# Patient Record
Sex: Female | Born: 1960
Health system: Southern US, Community
[De-identification: ages and names within clinical notes are randomized; demographics above are authoritative.]

## PROBLEM LIST (undated history)

## (undated) DIAGNOSIS — F418 Other specified anxiety disorders: Principal | ICD-10-CM

## (undated) DIAGNOSIS — R030 Elevated blood-pressure reading, without diagnosis of hypertension: Secondary | ICD-10-CM

## (undated) DIAGNOSIS — H9190 Unspecified hearing loss, unspecified ear: Secondary | ICD-10-CM

## (undated) DIAGNOSIS — R112 Nausea with vomiting, unspecified: Secondary | ICD-10-CM

## (undated) DIAGNOSIS — F419 Anxiety disorder, unspecified: Secondary | ICD-10-CM

## (undated) DIAGNOSIS — H04123 Dry eye syndrome of bilateral lacrimal glands: Secondary | ICD-10-CM

## (undated) DIAGNOSIS — R7303 Prediabetes: Secondary | ICD-10-CM

## (undated) DIAGNOSIS — E7800 Pure hypercholesterolemia, unspecified: Secondary | ICD-10-CM

## (undated) DIAGNOSIS — F329 Major depressive disorder, single episode, unspecified: Secondary | ICD-10-CM

## (undated) DIAGNOSIS — K219 Gastro-esophageal reflux disease without esophagitis: Secondary | ICD-10-CM

## (undated) DIAGNOSIS — E559 Vitamin D deficiency, unspecified: Secondary | ICD-10-CM

## (undated) DIAGNOSIS — E663 Overweight: Secondary | ICD-10-CM

## (undated) DIAGNOSIS — I10 Essential (primary) hypertension: Secondary | ICD-10-CM

## (undated) DIAGNOSIS — R5383 Other fatigue: Secondary | ICD-10-CM

## (undated) DIAGNOSIS — E039 Hypothyroidism, unspecified: Secondary | ICD-10-CM

## (undated) DIAGNOSIS — R0602 Shortness of breath: Secondary | ICD-10-CM

## (undated) DIAGNOSIS — K760 Fatty (change of) liver, not elsewhere classified: Secondary | ICD-10-CM

## (undated) DIAGNOSIS — Z9889 Other specified postprocedural states: Secondary | ICD-10-CM

## (undated) DIAGNOSIS — G4730 Sleep apnea, unspecified: Secondary | ICD-10-CM

## (undated) DIAGNOSIS — R42 Dizziness and giddiness: Secondary | ICD-10-CM

## (undated) DIAGNOSIS — F32A Depression, unspecified: Secondary | ICD-10-CM

## (undated) DIAGNOSIS — N979 Female infertility, unspecified: Secondary | ICD-10-CM

## (undated) DIAGNOSIS — Z9621 Cochlear implant status: Secondary | ICD-10-CM

## (undated) HISTORY — DX: Dizziness and giddiness: R42

## (undated) HISTORY — DX: Gastro-esophageal reflux disease without esophagitis: K21.9

## (undated) HISTORY — PX: DILATION AND CURETTAGE OF UTERUS: SHX78

## (undated) HISTORY — DX: Prediabetes: R73.03

## (undated) HISTORY — DX: Fatty (change of) liver, not elsewhere classified: K76.0

## (undated) HISTORY — DX: Hypothyroidism, unspecified: E03.9

## (undated) HISTORY — PX: EYE SURGERY: SHX253

## (undated) HISTORY — DX: Shortness of breath: R06.02

## (undated) HISTORY — DX: Major depressive disorder, single episode, unspecified: F32.9

## (undated) HISTORY — DX: Overweight: E66.3

## (undated) HISTORY — DX: Cochlear implant status: Z96.21

## (undated) HISTORY — DX: Vitamin D deficiency, unspecified: E55.9

## (undated) HISTORY — DX: Elevated blood-pressure reading, without diagnosis of hypertension: R03.0

## (undated) HISTORY — DX: Pure hypercholesterolemia, unspecified: E78.00

## (undated) HISTORY — DX: Other specified anxiety disorders: F41.8

## (undated) HISTORY — PX: FOOT SURGERY: SHX648

## (undated) HISTORY — DX: Anxiety disorder, unspecified: F41.9

## (undated) HISTORY — DX: Female infertility, unspecified: N97.9

## (undated) HISTORY — DX: Sleep apnea, unspecified: G47.30

## (undated) HISTORY — DX: Essential (primary) hypertension: I10

## (undated) HISTORY — PX: TONSILLECTOMY: SHX5217

## (undated) HISTORY — DX: Other fatigue: R53.83

## (undated) HISTORY — DX: Depression, unspecified: F32.A

## (undated) HISTORY — DX: Dry eye syndrome of bilateral lacrimal glands: H04.123

## (undated) HISTORY — DX: Unspecified hearing loss, unspecified ear: H91.90

## (undated) SURGICAL SUPPLY — 78 items
ATTRACTOMAT 16X20 MAGNETIC DRP (DRAPES)
BAG DECANTER FOR FLEXI CONT (MISCELLANEOUS) ×2
BENZOIN TINCTURE PRP APPL 2/3 (GAUZE/BANDAGES/DRESSINGS)
BLADE CLIPPER SURG (BLADE) ×2
BLADE NEEDLE 3 SS STRL (BLADE) ×1
BLADE NEEDLE 3MM SS STRL (BLADE) ×1
BUR DIAMOND COARSE 3.0 (BURR) ×2
BUR ROUNG CUTTER 5.0 (BUR) ×2
BUR SABER RD CUTTING 3.0 (BURR) ×1
BUR SABER RD CUTTING 3.0MM (BURR) ×1
BUR SABER TAPERED DIAMOND 1 (BURR) ×2
CANISTER SUCT 1200ML W/VALVE (MISCELLANEOUS) ×2
CLOSURE WOUND 1/2 X4 (GAUZE/BANDAGES/DRESSINGS) ×1
CORD BIPOLAR FORCEPS 12FT (ELECTRODE) ×2
COTTONBALL LRG STERILE PKG (GAUZE/BANDAGES/DRESSINGS) ×2
COVER PROBE 5X48 (MISCELLANEOUS)
COVER WAND RF STERILE (DRAPES)
DECANTER SPIKE VIAL GLASS SM (MISCELLANEOUS)
DEPRESSOR TONGUE BLADE STERILE (MISCELLANEOUS) ×2
DRAIN JACKSON RD 7FR 3/32 (WOUND CARE)
DRAIN PENROSE 1/4X12 LTX STRL (WOUND CARE)
DRAPE C-ARM 42X72 X-RAY (DRAPES) ×2
DRAPE INCISE IOBAN 66X45 STRL (DRAPES) ×2
DRAPE MICROSCOPE WILD 40.5X102 (DRAPES) ×2
DRAPE SURG 17X23 STRL (DRAPES) ×2
DRSG GLASSCOCK MASTOID ADT (GAUZE/BANDAGES/DRESSINGS) ×2
ELECT COATED BLADE 2.86 ST (ELECTRODE) ×2
ELECT PAIRED SUBDERMAL (MISCELLANEOUS) ×3
ELECT REM PT RETURN 9FT ADLT (ELECTROSURGICAL) ×3
GAUZE 4X4 16PLY RFD (DISPOSABLE) ×2
GAUZE SPONGE 4X4 12PLY STRL (GAUZE/BANDAGES/DRESSINGS)
GLOVE BIO SURGEON STRL SZ 6.5 (GLOVE) ×1
GLOVE BIO SURGEON STRL SZ7 (GLOVE) ×4
GLOVE BIO SURGEONS STRL SZ 6.5 (GLOVE) ×1
GLOVE BIOGEL M 6.5 STRL (GLOVE) ×6
GLOVE BIOGEL PI INDICATOR 7.0 (GLOVE) ×2
GLOVE ECLIPSE 7.5 STRL STRAW (GLOVE) ×6
GOWN STRL REUS W/TWL LRG LVL3 (GOWN DISPOSABLE) ×4
GOWN STRL REUS W/TWL XL LVL3 (GOWN DISPOSABLE) ×4
IV NS 500ML (IV SOLUTION) ×2
IV NS IRRIG 3000ML ARTHROMATIC (IV SOLUTION) ×2
IV SET EXT 30 76VOL 4 MALE LL (IV SETS) ×2
KIT SONNET EAS TWO PROCESSOR (KITS) ×2
MARKER SKIN DUAL TIP RULER LAB (MISCELLANEOUS) ×2
NEEDLE HYPO 18GX1.5 BLUNT FILL (NEEDLE) ×2
NEEDLE HYPO 18GX1.5 SHARP (NEEDLE) ×2
NEEDLE HYPO 22GX1.5 SAFETY (NEEDLE) ×2
NEEDLE HYPO 25X1 1.5 SAFETY (NEEDLE) ×4
NEEDLE SPNL 25GX3.5 QUINCKE BL (NEEDLE) ×2
NS IRRIG 1000ML POUR BTL (IV SOLUTION) ×2
PACK BASIN DAY SURGERY FS (CUSTOM PROCEDURE TRAY) ×2
PACK ENT DAY SURGERY (CUSTOM PROCEDURE TRAY) ×2
PATTIES SURGICAL .25X.25 (GAUZE/BANDAGES/DRESSINGS) ×2
PENCIL BUTTON HOLSTER BLD 10FT (ELECTRODE) ×2
PIN COCHLEAR FLEX 24 (Prosthesis and Implant ENT) ×2 IMPLANT
PIN SAFETY STERILE (MISCELLANEOUS)
PROBE NERVBE PRASS .33 (MISCELLANEOUS) ×2
SET IRRIG Y TYPE TUR BLADDER L (SET/KITS/TRAYS/PACK) ×2
SLEEVE SCD COMPRESS KNEE MED (MISCELLANEOUS) ×2
SPONGE NEURO XRAY DETECT 1X3 (DISPOSABLE) ×2
SPONGE SURGIFOAM ABS GEL 100 (HEMOSTASIS) ×2
SPONGE SURGIFOAM ABS GEL 12-7 (HEMOSTASIS) ×2
STAPLER VISISTAT (STAPLE) ×2
STRIP CLOSURE SKIN 1/2X4 (GAUZE/BANDAGES/DRESSINGS) ×1
SUT BONE WAX W31G (SUTURE) ×2
SUT CHROMIC 3 0 PS 2 (SUTURE) ×8
SUT CHROMIC 3 0 SH 27 (SUTURE) ×2
SUT ETHILON 5 0 PS 2 18 (SUTURE)
SUT NOVAFIL 5 0 BLK 18 IN P13 (SUTURE)
SYR 3ML 23GX1 SAFETY (SYRINGE) ×2
SYR BULB 3OZ (MISCELLANEOUS) ×2
SYR TB 1ML LL NO SAFETY (SYRINGE) ×4
TOWEL GREEN STERILE FF (TOWEL DISPOSABLE) ×4
TOWEL OR NON WOVEN STRL DISP B (DISPOSABLE) ×2
TRAY DSU PREP LF (CUSTOM PROCEDURE TRAY) ×2
TRAY FOLEY W/BAG SLVR 14FR LF (SET/KITS/TRAYS/PACK) ×2
TRAY FOLEY W/BAG SLVR 16FR LF (SET/KITS/TRAYS/PACK)
TUBING IRRIGATION (MISCELLANEOUS) ×2

---

## 2013-12-18 DIAGNOSIS — R748 Abnormal levels of other serum enzymes: Secondary | ICD-10-CM

## 2013-12-23 DIAGNOSIS — R748 Abnormal levels of other serum enzymes: Secondary | ICD-10-CM

## 2014-12-26 VITALS — Ht 62.0 in | Wt 195.0 lb

## 2014-12-26 DIAGNOSIS — M722 Plantar fascial fibromatosis: Secondary | ICD-10-CM | POA: Diagnosis not present

## 2014-12-26 MED ORDER — MELOXICAM 15 MG PO TABS: 15.0000 mg | ORAL_TABLET | Freq: Every day | ORAL | Status: DC

## 2014-12-26 MED ORDER — TRIAMCINOLONE ACETONIDE 10 MG/ML IJ SUSP
10.0000 mg | Freq: Once | INTRAMUSCULAR | Status: AC
  Administered 2014-12-26: 10 mg

## 2015-05-19 DIAGNOSIS — R748 Abnormal levels of other serum enzymes: Secondary | ICD-10-CM | POA: Diagnosis not present

## 2015-05-19 DIAGNOSIS — E782 Mixed hyperlipidemia: Secondary | ICD-10-CM | POA: Diagnosis not present

## 2015-05-28 DIAGNOSIS — F329 Major depressive disorder, single episode, unspecified: Secondary | ICD-10-CM | POA: Diagnosis not present

## 2015-05-28 DIAGNOSIS — F419 Anxiety disorder, unspecified: Secondary | ICD-10-CM | POA: Diagnosis not present

## 2015-05-28 DIAGNOSIS — E039 Hypothyroidism, unspecified: Secondary | ICD-10-CM | POA: Diagnosis not present

## 2015-06-01 DIAGNOSIS — F4320 Adjustment disorder, unspecified: Secondary | ICD-10-CM | POA: Diagnosis not present

## 2015-06-08 DIAGNOSIS — L821 Other seborrheic keratosis: Secondary | ICD-10-CM | POA: Diagnosis not present

## 2015-06-08 DIAGNOSIS — D235 Other benign neoplasm of skin of trunk: Secondary | ICD-10-CM | POA: Diagnosis not present

## 2015-06-08 DIAGNOSIS — L812 Freckles: Secondary | ICD-10-CM | POA: Diagnosis not present

## 2015-06-08 DIAGNOSIS — D1801 Hemangioma of skin and subcutaneous tissue: Secondary | ICD-10-CM | POA: Diagnosis not present

## 2015-06-29 DIAGNOSIS — F4320 Adjustment disorder, unspecified: Secondary | ICD-10-CM | POA: Diagnosis not present

## 2015-07-16 DIAGNOSIS — F4320 Adjustment disorder, unspecified: Secondary | ICD-10-CM | POA: Diagnosis not present

## 2015-08-10 DIAGNOSIS — F4320 Adjustment disorder, unspecified: Secondary | ICD-10-CM | POA: Diagnosis not present

## 2015-08-27 DIAGNOSIS — F4320 Adjustment disorder, unspecified: Secondary | ICD-10-CM | POA: Diagnosis not present

## 2015-09-21 DIAGNOSIS — F4320 Adjustment disorder, unspecified: Secondary | ICD-10-CM | POA: Diagnosis not present

## 2015-12-09 DIAGNOSIS — E039 Hypothyroidism, unspecified: Secondary | ICD-10-CM | POA: Diagnosis not present

## 2015-12-09 DIAGNOSIS — F419 Anxiety disorder, unspecified: Secondary | ICD-10-CM | POA: Diagnosis not present

## 2015-12-09 DIAGNOSIS — K760 Fatty (change of) liver, not elsewhere classified: Secondary | ICD-10-CM | POA: Diagnosis not present

## 2016-04-06 DIAGNOSIS — R05 Cough: Secondary | ICD-10-CM | POA: Diagnosis not present

## 2016-04-06 DIAGNOSIS — R0981 Nasal congestion: Secondary | ICD-10-CM | POA: Diagnosis not present

## 2016-04-06 DIAGNOSIS — J069 Acute upper respiratory infection, unspecified: Secondary | ICD-10-CM | POA: Diagnosis not present

## 2016-04-06 DIAGNOSIS — H6980 Other specified disorders of Eustachian tube, unspecified ear: Secondary | ICD-10-CM | POA: Diagnosis not present

## 2016-05-12 DIAGNOSIS — E039 Hypothyroidism, unspecified: Secondary | ICD-10-CM | POA: Diagnosis not present

## 2016-05-12 DIAGNOSIS — K760 Fatty (change of) liver, not elsewhere classified: Secondary | ICD-10-CM | POA: Diagnosis not present

## 2016-05-12 DIAGNOSIS — H8110 Benign paroxysmal vertigo, unspecified ear: Secondary | ICD-10-CM | POA: Diagnosis not present

## 2016-05-12 DIAGNOSIS — F419 Anxiety disorder, unspecified: Secondary | ICD-10-CM | POA: Diagnosis not present

## 2016-05-17 DIAGNOSIS — H903 Sensorineural hearing loss, bilateral: Secondary | ICD-10-CM | POA: Diagnosis not present

## 2016-06-15 DIAGNOSIS — H903 Sensorineural hearing loss, bilateral: Secondary | ICD-10-CM | POA: Diagnosis not present

## 2016-08-18 DIAGNOSIS — F4320 Adjustment disorder, unspecified: Secondary | ICD-10-CM | POA: Diagnosis not present

## 2016-09-09 DIAGNOSIS — H903 Sensorineural hearing loss, bilateral: Secondary | ICD-10-CM

## 2016-09-12 DIAGNOSIS — H5213 Myopia, bilateral: Secondary | ICD-10-CM | POA: Diagnosis not present

## 2016-11-15 DIAGNOSIS — F419 Anxiety disorder, unspecified: Secondary | ICD-10-CM | POA: Diagnosis not present

## 2017-03-01 DIAGNOSIS — F419 Anxiety disorder, unspecified: Secondary | ICD-10-CM | POA: Diagnosis not present

## 2017-03-29 DIAGNOSIS — F419 Anxiety disorder, unspecified: Secondary | ICD-10-CM | POA: Diagnosis not present

## 2017-04-05 DIAGNOSIS — H04123 Dry eye syndrome of bilateral lacrimal glands: Secondary | ICD-10-CM | POA: Diagnosis not present

## 2017-04-05 DIAGNOSIS — H10413 Chronic giant papillary conjunctivitis, bilateral: Secondary | ICD-10-CM | POA: Diagnosis not present

## 2017-04-07 DIAGNOSIS — K760 Fatty (change of) liver, not elsewhere classified: Secondary | ICD-10-CM | POA: Diagnosis not present

## 2017-04-07 DIAGNOSIS — E782 Mixed hyperlipidemia: Secondary | ICD-10-CM | POA: Diagnosis not present

## 2017-04-07 DIAGNOSIS — F329 Major depressive disorder, single episode, unspecified: Secondary | ICD-10-CM | POA: Diagnosis not present

## 2017-04-07 DIAGNOSIS — E039 Hypothyroidism, unspecified: Secondary | ICD-10-CM | POA: Diagnosis not present

## 2017-04-07 DIAGNOSIS — E559 Vitamin D deficiency, unspecified: Secondary | ICD-10-CM | POA: Diagnosis not present

## 2017-06-15 DIAGNOSIS — Z978 Presence of other specified devices: Secondary | ICD-10-CM | POA: Diagnosis not present

## 2017-06-15 DIAGNOSIS — R42 Dizziness and giddiness: Secondary | ICD-10-CM | POA: Diagnosis not present

## 2017-06-15 DIAGNOSIS — H9313 Tinnitus, bilateral: Secondary | ICD-10-CM | POA: Diagnosis not present

## 2017-06-15 DIAGNOSIS — R2689 Other abnormalities of gait and mobility: Secondary | ICD-10-CM | POA: Diagnosis not present

## 2017-06-15 DIAGNOSIS — H9193 Unspecified hearing loss, bilateral: Secondary | ICD-10-CM | POA: Diagnosis not present

## 2017-06-15 DIAGNOSIS — H903 Sensorineural hearing loss, bilateral: Secondary | ICD-10-CM | POA: Diagnosis not present

## 2017-06-29 DIAGNOSIS — Z6833 Body mass index (BMI) 33.0-33.9, adult: Secondary | ICD-10-CM | POA: Diagnosis not present

## 2017-06-29 DIAGNOSIS — Z01419 Encounter for gynecological examination (general) (routine) without abnormal findings: Secondary | ICD-10-CM | POA: Diagnosis not present

## 2017-06-29 DIAGNOSIS — Z1389 Encounter for screening for other disorder: Secondary | ICD-10-CM | POA: Diagnosis not present

## 2017-06-29 DIAGNOSIS — Z1231 Encounter for screening mammogram for malignant neoplasm of breast: Secondary | ICD-10-CM | POA: Diagnosis not present

## 2017-06-29 DIAGNOSIS — Z124 Encounter for screening for malignant neoplasm of cervix: Secondary | ICD-10-CM | POA: Diagnosis not present

## 2017-07-04 DIAGNOSIS — E039 Hypothyroidism, unspecified: Secondary | ICD-10-CM | POA: Diagnosis not present

## 2017-07-04 DIAGNOSIS — E559 Vitamin D deficiency, unspecified: Secondary | ICD-10-CM | POA: Diagnosis not present

## 2017-07-04 DIAGNOSIS — F419 Anxiety disorder, unspecified: Secondary | ICD-10-CM | POA: Diagnosis not present

## 2017-07-04 DIAGNOSIS — E782 Mixed hyperlipidemia: Secondary | ICD-10-CM | POA: Diagnosis not present

## 2017-08-14 DIAGNOSIS — Z9621 Cochlear implant status: Secondary | ICD-10-CM | POA: Diagnosis not present

## 2017-08-14 DIAGNOSIS — H903 Sensorineural hearing loss, bilateral: Secondary | ICD-10-CM | POA: Diagnosis not present

## 2017-09-14 DIAGNOSIS — E039 Hypothyroidism, unspecified: Secondary | ICD-10-CM | POA: Diagnosis not present

## 2017-09-14 DIAGNOSIS — M542 Cervicalgia: Secondary | ICD-10-CM | POA: Diagnosis not present

## 2017-09-18 DIAGNOSIS — R42 Dizziness and giddiness: Secondary | ICD-10-CM | POA: Diagnosis not present

## 2017-09-18 DIAGNOSIS — H9313 Tinnitus, bilateral: Secondary | ICD-10-CM | POA: Diagnosis not present

## 2017-09-18 DIAGNOSIS — H903 Sensorineural hearing loss, bilateral: Secondary | ICD-10-CM | POA: Diagnosis not present

## 2017-12-11 DIAGNOSIS — Z23 Encounter for immunization: Secondary | ICD-10-CM | POA: Diagnosis not present

## 2017-12-11 DIAGNOSIS — F419 Anxiety disorder, unspecified: Secondary | ICD-10-CM | POA: Diagnosis not present

## 2017-12-11 DIAGNOSIS — N904 Leukoplakia of vulva: Secondary | ICD-10-CM | POA: Diagnosis not present

## 2017-12-11 DIAGNOSIS — H9190 Unspecified hearing loss, unspecified ear: Secondary | ICD-10-CM | POA: Diagnosis not present

## 2017-12-11 DIAGNOSIS — Z01818 Encounter for other preprocedural examination: Secondary | ICD-10-CM | POA: Diagnosis not present

## 2017-12-21 DIAGNOSIS — H903 Sensorineural hearing loss, bilateral: Secondary | ICD-10-CM

## 2017-12-21 DIAGNOSIS — H9313 Tinnitus, bilateral: Secondary | ICD-10-CM

## 2018-01-04 DIAGNOSIS — H9190 Unspecified hearing loss, unspecified ear: Secondary | ICD-10-CM | POA: Diagnosis not present

## 2018-01-04 DIAGNOSIS — H903 Sensorineural hearing loss, bilateral: Secondary | ICD-10-CM

## 2018-01-04 DIAGNOSIS — H9313 Tinnitus, bilateral: Secondary | ICD-10-CM

## 2018-01-04 MED ORDER — GADOBENATE DIMEGLUMINE 529 MG/ML IV SOLN
18.0000 mL | Freq: Once | INTRAVENOUS | Status: AC | PRN
  Administered 2018-01-04: 18 mL via INTRAVENOUS

## 2018-01-05 DIAGNOSIS — H903 Sensorineural hearing loss, bilateral: Secondary | ICD-10-CM | POA: Diagnosis not present

## 2018-01-05 DIAGNOSIS — H9313 Tinnitus, bilateral: Secondary | ICD-10-CM | POA: Diagnosis not present

## 2018-01-17 DIAGNOSIS — F419 Anxiety disorder, unspecified: Secondary | ICD-10-CM | POA: Insufficient documentation

## 2018-01-17 DIAGNOSIS — K760 Fatty (change of) liver, not elsewhere classified: Secondary | ICD-10-CM | POA: Insufficient documentation

## 2018-01-17 DIAGNOSIS — Z79899 Other long term (current) drug therapy: Secondary | ICD-10-CM | POA: Insufficient documentation

## 2018-01-17 DIAGNOSIS — Z9621 Cochlear implant status: Secondary | ICD-10-CM | POA: Diagnosis not present

## 2018-01-17 DIAGNOSIS — H9313 Tinnitus, bilateral: Secondary | ICD-10-CM | POA: Diagnosis not present

## 2018-01-17 DIAGNOSIS — E039 Hypothyroidism, unspecified: Secondary | ICD-10-CM | POA: Diagnosis not present

## 2018-01-17 DIAGNOSIS — Z4889 Encounter for other specified surgical aftercare: Secondary | ICD-10-CM

## 2018-01-17 DIAGNOSIS — H903 Sensorineural hearing loss, bilateral: Secondary | ICD-10-CM | POA: Diagnosis not present

## 2018-01-17 DIAGNOSIS — Z419 Encounter for procedure for purposes other than remedying health state, unspecified: Secondary | ICD-10-CM

## 2018-01-17 HISTORY — PX: COCHLEAR IMPLANT: SHX184

## 2018-01-17 HISTORY — DX: Nausea with vomiting, unspecified: R11.2

## 2018-01-17 HISTORY — DX: Other specified postprocedural states: Z98.890

## 2018-01-17 HISTORY — DX: Hypothyroidism, unspecified: E03.9

## 2018-01-17 HISTORY — DX: Gastro-esophageal reflux disease without esophagitis: K21.9

## 2018-01-17 MED ORDER — FENTANYL CITRATE (PF) 100 MCG/2ML IJ SOLN
25.0000 ug | INTRAMUSCULAR | Status: DC | PRN
  Administered 2018-01-17: 25 ug via INTRAVENOUS

## 2018-01-17 MED ORDER — CEFAZOLIN SODIUM-DEXTROSE 2-3 GM-%(50ML) IV SOLR
INTRAVENOUS | Status: DC | PRN
  Administered 2018-01-17: 2 g via INTRAVENOUS

## 2018-01-17 MED ORDER — MIDAZOLAM HCL 2 MG/2ML IJ SOLN
1.0000 mg | INTRAMUSCULAR | Status: DC | PRN
  Administered 2018-01-17: 2 mg via INTRAVENOUS

## 2018-01-17 MED ORDER — ONDANSETRON HCL 4 MG/2ML IJ SOLN: 4.0000 mg | INTRAMUSCULAR | Status: DC

## 2018-01-17 MED ORDER — DEXAMETHASONE SODIUM PHOSPHATE 4 MG/ML IJ SOLN
INTRAMUSCULAR | Status: DC | PRN
  Administered 2018-01-17: 10 mg via INTRAVENOUS

## 2018-01-17 MED ORDER — BACITRACIN ZINC 500 UNIT/GM EX OINT: 1.0000 "application " | TOPICAL_OINTMENT | Freq: Three times a day (TID) | CUTANEOUS | Status: DC

## 2018-01-17 MED ORDER — BACITRACIN ZINC 500 UNIT/GM EX OINT
TOPICAL_OINTMENT | CUTANEOUS | Status: DC | PRN
  Administered 2018-01-17: 1 via TOPICAL

## 2018-01-17 MED ORDER — IBUPROFEN 100 MG/5ML PO SUSP: 400.0000 mg | Freq: Four times a day (QID) | ORAL | Status: DC | PRN

## 2018-01-17 MED ORDER — SUCCINYLCHOLINE CHLORIDE 20 MG/ML IJ SOLN
INTRAMUSCULAR | Status: DC | PRN
  Administered 2018-01-17: 100 mg via INTRAVENOUS

## 2018-01-17 MED ORDER — EPINEPHRINE PF 1 MG/ML IJ SOLN
INTRAMUSCULAR | Status: DC | PRN
  Administered 2018-01-17: 1 mg

## 2018-01-17 MED ORDER — DEXAMETHASONE SODIUM PHOSPHATE 10 MG/ML IJ SOLN: INTRAMUSCULAR | Status: AC

## 2018-01-17 MED ORDER — BACITRACIN ZINC 500 UNIT/GM EX OINT: TOPICAL_OINTMENT | CUTANEOUS | Status: AC

## 2018-01-17 MED ORDER — MUPIROCIN 2 % EX OINT: TOPICAL_OINTMENT | CUTANEOUS | Status: AC

## 2018-01-17 MED ORDER — ACETAMINOPHEN 500 MG PO TABS: ORAL_TABLET | ORAL | Status: AC

## 2018-01-17 MED ORDER — PROMETHAZINE HCL 25 MG/ML IJ SOLN: 6.2500 mg | INTRAMUSCULAR | Status: DC | PRN

## 2018-01-17 MED ORDER — FENTANYL CITRATE (PF) 100 MCG/2ML IJ SOLN: INTRAMUSCULAR | Status: AC

## 2018-01-17 MED ORDER — DEXTROSE IN LACTATED RINGERS 5 % IV SOLN
INTRAVENOUS | Status: DC
  Administered 2018-01-17 – 2018-01-18 (×2): via INTRAVENOUS

## 2018-01-17 MED ORDER — ONDANSETRON HCL 4 MG/2ML IJ SOLN
INTRAMUSCULAR | Status: DC | PRN
  Administered 2018-01-17 (×2): 4 mg via INTRAVENOUS

## 2018-01-17 MED ORDER — EPHEDRINE 5 MG/ML INJ: INTRAVENOUS | Status: AC

## 2018-01-17 MED ORDER — LIDOCAINE 2% (20 MG/ML) 5 ML SYRINGE: INTRAMUSCULAR | Status: AC

## 2018-01-17 MED ORDER — MIDAZOLAM HCL 2 MG/2ML IJ SOLN: INTRAMUSCULAR | Status: AC

## 2018-01-17 MED ORDER — FENTANYL CITRATE (PF) 100 MCG/2ML IJ SOLN: 50.0000 ug | INTRAMUSCULAR | Status: DC | PRN

## 2018-01-17 MED ORDER — METHYLENE BLUE 0.5 % INJ SOLN
INTRAVENOUS | Status: DC | PRN
  Administered 2018-01-17: 1 mL

## 2018-01-17 MED ORDER — ZOLPIDEM TARTRATE 5 MG PO TABS: 5.0000 mg | ORAL_TABLET | Freq: Every evening | ORAL | Status: DC | PRN

## 2018-01-17 MED ORDER — PROPOFOL 500 MG/50ML IV EMUL
INTRAVENOUS | Status: DC | PRN
  Administered 2018-01-17: 12 ug/kg/min via INTRAVENOUS

## 2018-01-17 MED ORDER — ONDANSETRON HCL 4 MG PO TABS: 4.0000 mg | ORAL_TABLET | ORAL | Status: DC | PRN

## 2018-01-17 MED ORDER — LIDOCAINE HCL (CARDIAC) PF 100 MG/5ML IV SOSY
PREFILLED_SYRINGE | INTRAVENOUS | Status: DC | PRN
  Administered 2018-01-17: 80 mg via INTRAVENOUS

## 2018-01-17 MED ORDER — CEFAZOLIN SODIUM-DEXTROSE 2-4 GM/100ML-% IV SOLN: 2.0000 g | INTRAVENOUS | Status: DC

## 2018-01-17 MED ORDER — CEFAZOLIN SODIUM-DEXTROSE 1-4 GM/50ML-% IV SOLN
1.0000 g | Freq: Three times a day (TID) | INTRAVENOUS | Status: AC
  Administered 2018-01-17 – 2018-01-18 (×3): 1 g via INTRAVENOUS

## 2018-01-17 MED ORDER — SODIUM CHLORIDE 0.9 % IV SOLN
INTRAVENOUS | Status: DC | PRN
  Administered 2018-01-17: 10:00:00

## 2018-01-17 MED ORDER — MUPIROCIN 2 % EX OINT: 1.0000 "application " | TOPICAL_OINTMENT | Freq: Three times a day (TID) | CUTANEOUS | Status: DC

## 2018-01-17 MED ORDER — MORPHINE SULFATE (PF) 4 MG/ML IV SOLN: 2.0000 mg | INTRAVENOUS | Status: DC | PRN

## 2018-01-17 MED ORDER — SUCCINYLCHOLINE CHLORIDE 200 MG/10ML IV SOSY: PREFILLED_SYRINGE | INTRAVENOUS | Status: AC

## 2018-01-17 MED ORDER — ONDANSETRON HCL 4 MG/2ML IJ SOLN: 4.0000 mg | INTRAMUSCULAR | Status: DC | PRN

## 2018-01-17 MED ORDER — LIDOCAINE-EPINEPHRINE 1 %-1:100000 IJ SOLN: INTRAMUSCULAR | Status: AC

## 2018-01-17 MED ORDER — CIPROFLOXACIN-HYDROCORTISONE 0.2-1 % OT SUSP: OTIC | Status: AC

## 2018-01-17 MED ORDER — DEXAMETHASONE SODIUM PHOSPHATE 10 MG/ML IJ SOLN
8.0000 mg | Freq: Four times a day (QID) | INTRAMUSCULAR | Status: AC
  Administered 2018-01-17 (×2): 8 mg via INTRAVENOUS

## 2018-01-17 MED ORDER — PHENYLEPHRINE 40 MCG/ML (10ML) SYRINGE FOR IV PUSH (FOR BLOOD PRESSURE SUPPORT): PREFILLED_SYRINGE | INTRAVENOUS | Status: AC

## 2018-01-17 MED ORDER — METHYLENE BLUE 0.5 % INJ SOLN: INTRAVENOUS | Status: AC

## 2018-01-17 MED ORDER — CEFAZOLIN SODIUM-DEXTROSE 2-4 GM/100ML-% IV SOLN: INTRAVENOUS | Status: AC

## 2018-01-17 MED ORDER — CIPROFLOXACIN-HYDROCORTISONE 0.2-1 % OT SUSP
OTIC | Status: DC | PRN
  Administered 2018-01-17: 3 [drp] via OTIC

## 2018-01-17 MED ORDER — DIPHENHYDRAMINE HCL 50 MG/ML IJ SOLN: INTRAMUSCULAR | Status: AC

## 2018-01-17 MED ORDER — EPHEDRINE SULFATE 50 MG/ML IJ SOLN
INTRAMUSCULAR | Status: DC | PRN
  Administered 2018-01-17: 10 mg via INTRAVENOUS

## 2018-01-17 MED ORDER — ACETAMINOPHEN 500 MG PO TABS
1000.0000 mg | ORAL_TABLET | Freq: Once | ORAL | Status: AC
  Administered 2018-01-17: 1000 mg via ORAL

## 2018-01-17 MED ORDER — PROPOFOL 10 MG/ML IV BOLUS
INTRAVENOUS | Status: DC | PRN
  Administered 2018-01-17: 150 mg via INTRAVENOUS

## 2018-01-17 MED ORDER — SUFENTANIL CITRATE 50 MCG/ML IV SOLN: INTRAVENOUS | Status: AC

## 2018-01-17 MED ORDER — EPINEPHRINE 30 MG/30ML IJ SOLN: INTRAMUSCULAR | Status: AC

## 2018-01-17 MED ORDER — LACTATED RINGERS IV SOLN
INTRAVENOUS | Status: DC
  Administered 2018-01-17 (×3): via INTRAVENOUS

## 2018-01-17 MED ORDER — SCOPOLAMINE 1 MG/3DAYS TD PT72
1.0000 | MEDICATED_PATCH | Freq: Once | TRANSDERMAL | Status: DC | PRN
  Administered 2018-01-17: 1.5 mg via TRANSDERMAL

## 2018-01-17 MED ORDER — LEVOTHYROXINE SODIUM 112 MCG PO TABS: 112.0000 ug | ORAL_TABLET | Freq: Once | ORAL | Status: DC

## 2018-01-17 MED ORDER — LIDOCAINE-EPINEPHRINE 1 %-1:100000 IJ SOLN
INTRAMUSCULAR | Status: DC | PRN
  Administered 2018-01-17: 5 mL

## 2018-01-17 MED ORDER — ONDANSETRON HCL 4 MG/2ML IJ SOLN: INTRAMUSCULAR | Status: AC

## 2018-01-17 MED ORDER — SCOPOLAMINE 1 MG/3DAYS TD PT72: MEDICATED_PATCH | TRANSDERMAL | Status: AC

## 2018-01-17 MED ORDER — SUFENTANIL CITRATE 50 MCG/ML IV SOLN
INTRAVENOUS | Status: DC | PRN
  Administered 2018-01-17: 5 ug via INTRAVENOUS
  Administered 2018-01-17: 10 ug via INTRAVENOUS
  Administered 2018-01-17: 5 ug via INTRAVENOUS

## 2018-01-17 MED ORDER — HYDROCODONE-ACETAMINOPHEN 5-325 MG PO TABS
1.0000 | ORAL_TABLET | ORAL | Status: DC | PRN
  Administered 2018-01-17 (×2): 0.5 via ORAL
  Administered 2018-01-17 (×3): 1 via ORAL
  Administered 2018-01-18: 2 via ORAL
  Administered 2018-01-18: 1 via ORAL

## 2018-01-17 MED ORDER — DIPHENHYDRAMINE HCL 50 MG/ML IJ SOLN
INTRAMUSCULAR | Status: DC | PRN
  Administered 2018-01-17: 12.5 mg via INTRAVENOUS

## 2018-01-17 MED ORDER — DEXAMETHASONE SODIUM PHOSPHATE 10 MG/ML IJ SOLN
10.0000 mg | INTRAMUSCULAR | Status: AC
  Administered 2018-01-17: 20 mg via INTRAVENOUS

## 2018-01-18 DIAGNOSIS — H903 Sensorineural hearing loss, bilateral: Secondary | ICD-10-CM | POA: Diagnosis not present

## 2018-02-01 DIAGNOSIS — H33322 Round hole, left eye: Secondary | ICD-10-CM | POA: Diagnosis not present

## 2018-02-01 DIAGNOSIS — H33021 Retinal detachment with multiple breaks, right eye: Secondary | ICD-10-CM | POA: Diagnosis not present

## 2018-02-01 DIAGNOSIS — H35721 Serous detachment of retinal pigment epithelium, right eye: Secondary | ICD-10-CM | POA: Diagnosis not present

## 2018-02-01 DIAGNOSIS — H43813 Vitreous degeneration, bilateral: Secondary | ICD-10-CM | POA: Diagnosis not present

## 2018-02-01 DIAGNOSIS — H3589 Other specified retinal disorders: Secondary | ICD-10-CM | POA: Diagnosis not present

## 2018-02-05 DIAGNOSIS — H35371 Puckering of macula, right eye: Secondary | ICD-10-CM | POA: Diagnosis not present

## 2018-02-05 DIAGNOSIS — H33021 Retinal detachment with multiple breaks, right eye: Secondary | ICD-10-CM | POA: Diagnosis not present

## 2018-02-16 DIAGNOSIS — H9313 Tinnitus, bilateral: Secondary | ICD-10-CM | POA: Diagnosis not present

## 2018-02-16 DIAGNOSIS — H903 Sensorineural hearing loss, bilateral: Secondary | ICD-10-CM | POA: Diagnosis not present

## 2018-02-28 DIAGNOSIS — H9313 Tinnitus, bilateral: Secondary | ICD-10-CM | POA: Diagnosis not present

## 2018-02-28 DIAGNOSIS — H903 Sensorineural hearing loss, bilateral: Secondary | ICD-10-CM | POA: Diagnosis not present

## 2018-03-22 DIAGNOSIS — H9313 Tinnitus, bilateral: Secondary | ICD-10-CM | POA: Diagnosis not present

## 2018-03-22 DIAGNOSIS — H903 Sensorineural hearing loss, bilateral: Secondary | ICD-10-CM | POA: Diagnosis not present

## 2018-04-13 DIAGNOSIS — H9313 Tinnitus, bilateral: Secondary | ICD-10-CM | POA: Diagnosis not present

## 2018-04-13 DIAGNOSIS — H903 Sensorineural hearing loss, bilateral: Secondary | ICD-10-CM | POA: Diagnosis not present

## 2018-04-16 VITALS — BP 146/84 | HR 85 | Temp 97.6°F | Ht 62.0 in | Wt 193.5 lb

## 2018-04-16 DIAGNOSIS — E559 Vitamin D deficiency, unspecified: Secondary | ICD-10-CM | POA: Insufficient documentation

## 2018-04-16 DIAGNOSIS — F418 Other specified anxiety disorders: Secondary | ICD-10-CM | POA: Diagnosis not present

## 2018-04-16 DIAGNOSIS — K219 Gastro-esophageal reflux disease without esophagitis: Secondary | ICD-10-CM

## 2018-04-16 DIAGNOSIS — E039 Hypothyroidism, unspecified: Secondary | ICD-10-CM | POA: Diagnosis not present

## 2018-04-16 DIAGNOSIS — E7800 Pure hypercholesterolemia, unspecified: Secondary | ICD-10-CM

## 2018-04-16 DIAGNOSIS — Z0000 Encounter for general adult medical examination without abnormal findings: Secondary | ICD-10-CM

## 2018-04-16 DIAGNOSIS — Z9621 Cochlear implant status: Secondary | ICD-10-CM

## 2018-04-16 HISTORY — DX: Vitamin D deficiency, unspecified: E55.9

## 2018-04-16 HISTORY — DX: Hypothyroidism, unspecified: E03.9

## 2018-04-16 HISTORY — DX: Gastro-esophageal reflux disease without esophagitis: K21.9

## 2018-04-16 HISTORY — DX: Other specified anxiety disorders: F41.8

## 2018-04-16 HISTORY — DX: Cochlear implant status: Z96.21

## 2018-04-16 HISTORY — DX: Pure hypercholesterolemia, unspecified: E78.00

## 2018-04-17 MED ORDER — ESOMEPRAZOLE MAGNESIUM 20 MG PO CPDR: 20.0000 mg | DELAYED_RELEASE_CAPSULE | Freq: Every day | ORAL | 3 refills | Status: AC

## 2018-04-17 MED ORDER — MECLIZINE HCL 25 MG PO TABS: 25.0000 mg | ORAL_TABLET | Freq: Three times a day (TID) | ORAL | 3 refills | Status: AC | PRN

## 2018-04-17 MED ORDER — LEVOTHYROXINE SODIUM 88 MCG PO TABS: 88.0000 ug | ORAL_TABLET | Freq: Every day | ORAL | 3 refills | Status: DC

## 2018-04-17 MED ORDER — BUPROPION HCL ER (XL) 150 MG PO TB24: 150.0000 mg | ORAL_TABLET | Freq: Every day | ORAL | 3 refills | Status: DC

## 2018-04-17 MED ORDER — CLONAZEPAM 0.5 MG PO TABS: 0.2500 mg | ORAL_TABLET | Freq: Two times a day (BID) | ORAL | 0 refills | Status: DC | PRN

## 2018-04-17 MED ORDER — FLONASE ALLERGY RELIEF 50 MCG/ACT NA SUSP: 1.0000 | Freq: Every day | NASAL | 11 refills | Status: DC

## 2018-05-03 DIAGNOSIS — H903 Sensorineural hearing loss, bilateral: Secondary | ICD-10-CM | POA: Diagnosis not present

## 2018-05-31 DIAGNOSIS — F419 Anxiety disorder, unspecified: Secondary | ICD-10-CM

## 2018-06-07 MED ORDER — LEVOTHYROXINE SODIUM 88 MCG PO TABS: 88.0000 ug | ORAL_TABLET | Freq: Every day | ORAL | 3 refills | Status: DC

## 2018-06-07 MED ORDER — BUPROPION HCL ER (XL) 150 MG PO TB24: 150.0000 mg | ORAL_TABLET | Freq: Every day | ORAL | 3 refills | Status: DC

## 2018-06-14 MED ORDER — LEVOTHYROXINE SODIUM 88 MCG PO TABS: 88.0000 ug | ORAL_TABLET | Freq: Every day | ORAL | 3 refills | Status: DC

## 2018-06-14 MED ORDER — BUPROPION HCL ER (XL) 150 MG PO TB24: 150.0000 mg | ORAL_TABLET | Freq: Every day | ORAL | 3 refills | Status: DC

## 2018-06-25 MED ORDER — BUPROPION HCL ER (XL) 150 MG PO TB24: 150.0000 mg | ORAL_TABLET | Freq: Every day | ORAL | 3 refills | Status: DC

## 2018-08-01 MED ORDER — LEVOTHYROXINE SODIUM 112 MCG PO TABS: ORAL_TABLET | ORAL | 1 refills | Status: DC

## 2018-08-01 MED ORDER — BUPROPION HCL ER (XL) 150 MG PO TB24: 150.0000 mg | ORAL_TABLET | Freq: Every day | ORAL | 1 refills | Status: DC

## 2018-08-23 DIAGNOSIS — H43392 Other vitreous opacities, left eye: Secondary | ICD-10-CM | POA: Diagnosis not present

## 2018-08-23 DIAGNOSIS — H33322 Round hole, left eye: Secondary | ICD-10-CM | POA: Diagnosis not present

## 2018-08-23 DIAGNOSIS — H43812 Vitreous degeneration, left eye: Secondary | ICD-10-CM | POA: Diagnosis not present

## 2018-08-23 DIAGNOSIS — H31091 Other chorioretinal scars, right eye: Secondary | ICD-10-CM | POA: Diagnosis not present

## 2018-09-05 DIAGNOSIS — F419 Anxiety disorder, unspecified: Secondary | ICD-10-CM

## 2018-11-07 DIAGNOSIS — H04123 Dry eye syndrome of bilateral lacrimal glands: Secondary | ICD-10-CM | POA: Diagnosis not present

## 2018-11-07 DIAGNOSIS — H52203 Unspecified astigmatism, bilateral: Secondary | ICD-10-CM | POA: Diagnosis not present

## 2018-11-07 DIAGNOSIS — H25813 Combined forms of age-related cataract, bilateral: Secondary | ICD-10-CM | POA: Diagnosis not present

## 2018-11-07 DIAGNOSIS — H527 Unspecified disorder of refraction: Secondary | ICD-10-CM | POA: Diagnosis not present

## 2018-11-07 DIAGNOSIS — H04122 Dry eye syndrome of left lacrimal gland: Secondary | ICD-10-CM | POA: Diagnosis not present

## 2018-11-07 DIAGNOSIS — H04121 Dry eye syndrome of right lacrimal gland: Secondary | ICD-10-CM | POA: Diagnosis not present

## 2018-11-07 DIAGNOSIS — H31091 Other chorioretinal scars, right eye: Secondary | ICD-10-CM | POA: Diagnosis not present

## 2018-12-19 DIAGNOSIS — F419 Anxiety disorder, unspecified: Secondary | ICD-10-CM

## 2018-12-21 DIAGNOSIS — H25813 Combined forms of age-related cataract, bilateral: Secondary | ICD-10-CM | POA: Diagnosis not present

## 2018-12-23 DIAGNOSIS — Z01818 Encounter for other preprocedural examination: Secondary | ICD-10-CM | POA: Diagnosis not present

## 2019-01-11 DIAGNOSIS — Z01818 Encounter for other preprocedural examination: Secondary | ICD-10-CM | POA: Diagnosis not present

## 2019-01-14 DIAGNOSIS — H52202 Unspecified astigmatism, left eye: Secondary | ICD-10-CM | POA: Diagnosis not present

## 2019-01-14 DIAGNOSIS — H33322 Round hole, left eye: Secondary | ICD-10-CM | POA: Diagnosis not present

## 2019-01-14 DIAGNOSIS — H43812 Vitreous degeneration, left eye: Secondary | ICD-10-CM | POA: Diagnosis not present

## 2019-01-14 DIAGNOSIS — H31001 Unspecified chorioretinal scars, right eye: Secondary | ICD-10-CM | POA: Diagnosis not present

## 2019-01-14 DIAGNOSIS — H31091 Other chorioretinal scars, right eye: Secondary | ICD-10-CM | POA: Diagnosis not present

## 2019-01-14 DIAGNOSIS — H04123 Dry eye syndrome of bilateral lacrimal glands: Secondary | ICD-10-CM | POA: Diagnosis not present

## 2019-01-14 DIAGNOSIS — H25812 Combined forms of age-related cataract, left eye: Secondary | ICD-10-CM | POA: Diagnosis not present

## 2019-01-14 DIAGNOSIS — E039 Hypothyroidism, unspecified: Secondary | ICD-10-CM | POA: Diagnosis not present

## 2019-01-14 DIAGNOSIS — H269 Unspecified cataract: Secondary | ICD-10-CM | POA: Diagnosis not present

## 2019-01-14 DIAGNOSIS — H02831 Dermatochalasis of right upper eyelid: Secondary | ICD-10-CM | POA: Diagnosis not present

## 2019-01-14 DIAGNOSIS — H25811 Combined forms of age-related cataract, right eye: Secondary | ICD-10-CM | POA: Diagnosis not present

## 2019-01-14 DIAGNOSIS — H527 Unspecified disorder of refraction: Secondary | ICD-10-CM | POA: Diagnosis not present

## 2019-01-14 DIAGNOSIS — H02834 Dermatochalasis of left upper eyelid: Secondary | ICD-10-CM | POA: Diagnosis not present

## 2019-01-14 DIAGNOSIS — Z79899 Other long term (current) drug therapy: Secondary | ICD-10-CM | POA: Diagnosis not present

## 2019-01-14 DIAGNOSIS — F339 Major depressive disorder, recurrent, unspecified: Secondary | ICD-10-CM | POA: Diagnosis not present

## 2019-01-14 DIAGNOSIS — H52203 Unspecified astigmatism, bilateral: Secondary | ICD-10-CM | POA: Diagnosis not present

## 2019-01-14 DIAGNOSIS — F419 Anxiety disorder, unspecified: Secondary | ICD-10-CM | POA: Diagnosis not present

## 2019-01-14 DIAGNOSIS — Z961 Presence of intraocular lens: Secondary | ICD-10-CM | POA: Diagnosis not present

## 2019-01-14 DIAGNOSIS — K219 Gastro-esophageal reflux disease without esophagitis: Secondary | ICD-10-CM | POA: Diagnosis not present

## 2019-01-21 MED ORDER — BUPROPION HCL ER (XL) 150 MG PO TB24: 150.0000 mg | ORAL_TABLET | Freq: Every day | ORAL | 1 refills | Status: DC

## 2019-01-21 MED ORDER — LEVOTHYROXINE SODIUM 112 MCG PO TABS: ORAL_TABLET | ORAL | 0 refills | Status: DC

## 2019-03-22 DIAGNOSIS — Z9621 Cochlear implant status: Secondary | ICD-10-CM | POA: Diagnosis not present

## 2019-03-22 DIAGNOSIS — H905 Unspecified sensorineural hearing loss: Secondary | ICD-10-CM | POA: Diagnosis not present

## 2019-03-25 DIAGNOSIS — E7800 Pure hypercholesterolemia, unspecified: Secondary | ICD-10-CM

## 2019-03-25 DIAGNOSIS — Z0000 Encounter for general adult medical examination without abnormal findings: Secondary | ICD-10-CM

## 2019-03-25 DIAGNOSIS — F419 Anxiety disorder, unspecified: Secondary | ICD-10-CM

## 2019-04-03 MED ORDER — CLONAZEPAM 0.5 MG PO TABS: 0.5000 mg | ORAL_TABLET | Freq: Two times a day (BID) | ORAL | 2 refills | Status: DC | PRN

## 2019-04-11 DIAGNOSIS — E7800 Pure hypercholesterolemia, unspecified: Secondary | ICD-10-CM | POA: Diagnosis not present

## 2019-04-11 DIAGNOSIS — Z0000 Encounter for general adult medical examination without abnormal findings: Secondary | ICD-10-CM | POA: Diagnosis not present

## 2019-04-18 DIAGNOSIS — Z1231 Encounter for screening mammogram for malignant neoplasm of breast: Secondary | ICD-10-CM

## 2019-04-18 DIAGNOSIS — E7800 Pure hypercholesterolemia, unspecified: Secondary | ICD-10-CM | POA: Diagnosis not present

## 2019-04-18 DIAGNOSIS — R7301 Impaired fasting glucose: Secondary | ICD-10-CM

## 2019-04-18 DIAGNOSIS — F418 Other specified anxiety disorders: Secondary | ICD-10-CM | POA: Diagnosis not present

## 2019-04-18 DIAGNOSIS — E039 Hypothyroidism, unspecified: Secondary | ICD-10-CM

## 2019-04-18 DIAGNOSIS — Z1239 Encounter for other screening for malignant neoplasm of breast: Secondary | ICD-10-CM

## 2019-04-18 MED ORDER — SERTRALINE HCL 25 MG PO TABS: 25.0000 mg | ORAL_TABLET | Freq: Every day | ORAL | 0 refills | Status: DC

## 2019-04-27 DIAGNOSIS — Z23 Encounter for immunization: Secondary | ICD-10-CM

## 2019-05-09 DIAGNOSIS — E7800 Pure hypercholesterolemia, unspecified: Secondary | ICD-10-CM | POA: Diagnosis not present

## 2019-05-09 DIAGNOSIS — Z1231 Encounter for screening mammogram for malignant neoplasm of breast: Secondary | ICD-10-CM | POA: Diagnosis not present

## 2019-05-09 DIAGNOSIS — R7301 Impaired fasting glucose: Secondary | ICD-10-CM | POA: Diagnosis not present

## 2019-05-09 DIAGNOSIS — E039 Hypothyroidism, unspecified: Secondary | ICD-10-CM | POA: Diagnosis not present

## 2019-05-09 DIAGNOSIS — F418 Other specified anxiety disorders: Secondary | ICD-10-CM | POA: Diagnosis not present

## 2019-05-16 DIAGNOSIS — H2512 Age-related nuclear cataract, left eye: Secondary | ICD-10-CM | POA: Diagnosis not present

## 2019-05-16 DIAGNOSIS — H40013 Open angle with borderline findings, low risk, bilateral: Secondary | ICD-10-CM | POA: Diagnosis not present

## 2019-05-22 DIAGNOSIS — Z23 Encounter for immunization: Secondary | ICD-10-CM

## 2019-05-23 DIAGNOSIS — Z48810 Encounter for surgical aftercare following surgery on the sense organs: Secondary | ICD-10-CM | POA: Diagnosis not present

## 2019-05-23 DIAGNOSIS — Z9621 Cochlear implant status: Secondary | ICD-10-CM | POA: Diagnosis not present

## 2019-05-23 DIAGNOSIS — H905 Unspecified sensorineural hearing loss: Secondary | ICD-10-CM | POA: Diagnosis not present

## 2019-05-29 DIAGNOSIS — F418 Other specified anxiety disorders: Secondary | ICD-10-CM | POA: Diagnosis not present

## 2019-05-29 MED ORDER — SERTRALINE HCL 25 MG PO TABS: 50.0000 mg | ORAL_TABLET | Freq: Every day | ORAL | 0 refills | Status: DC

## 2019-05-29 MED ORDER — LEVOTHYROXINE SODIUM 112 MCG PO TABS: ORAL_TABLET | ORAL | 3 refills | Status: DC

## 2019-05-29 MED ORDER — BUPROPION HCL ER (XL) 150 MG PO TB24: 150.0000 mg | ORAL_TABLET | Freq: Every day | ORAL | 3 refills | Status: DC

## 2019-06-05 MED ORDER — SERTRALINE HCL 50 MG PO TABS: 50.0000 mg | ORAL_TABLET | Freq: Every day | ORAL | 3 refills | Status: DC

## 2019-07-26 DIAGNOSIS — F418 Other specified anxiety disorders: Secondary | ICD-10-CM

## 2019-07-26 MED ORDER — BUPROPION HCL ER (XL) 150 MG PO TB24: 150.0000 mg | ORAL_TABLET | Freq: Every day | ORAL | 0 refills | Status: DC

## 2019-07-29 DIAGNOSIS — F419 Anxiety disorder, unspecified: Secondary | ICD-10-CM

## 2019-07-31 MED ORDER — BUPROPION HCL ER (XL) 150 MG PO TB24: 150.0000 mg | ORAL_TABLET | Freq: Every day | ORAL | 0 refills | Status: DC

## 2019-08-13 DIAGNOSIS — Z1159 Encounter for screening for other viral diseases: Secondary | ICD-10-CM | POA: Diagnosis not present

## 2019-08-14 DIAGNOSIS — Z20822 Contact with and (suspected) exposure to covid-19: Secondary | ICD-10-CM

## 2019-08-14 DIAGNOSIS — Z1159 Encounter for screening for other viral diseases: Secondary | ICD-10-CM | POA: Diagnosis not present

## 2019-10-25 DIAGNOSIS — F418 Other specified anxiety disorders: Secondary | ICD-10-CM

## 2019-10-28 DIAGNOSIS — H40013 Open angle with borderline findings, low risk, bilateral: Secondary | ICD-10-CM | POA: Diagnosis not present

## 2019-10-28 DIAGNOSIS — Z961 Presence of intraocular lens: Secondary | ICD-10-CM | POA: Diagnosis not present

## 2019-10-28 DIAGNOSIS — H16223 Keratoconjunctivitis sicca, not specified as Sjogren's, bilateral: Secondary | ICD-10-CM | POA: Diagnosis not present

## 2019-10-28 DIAGNOSIS — H25812 Combined forms of age-related cataract, left eye: Secondary | ICD-10-CM | POA: Diagnosis not present

## 2019-11-19 DIAGNOSIS — F418 Other specified anxiety disorders: Secondary | ICD-10-CM

## 2019-11-23 DIAGNOSIS — F419 Anxiety disorder, unspecified: Secondary | ICD-10-CM

## 2019-11-26 DIAGNOSIS — Z1211 Encounter for screening for malignant neoplasm of colon: Secondary | ICD-10-CM

## 2019-11-27 DIAGNOSIS — H25812 Combined forms of age-related cataract, left eye: Secondary | ICD-10-CM | POA: Diagnosis not present

## 2019-11-27 DIAGNOSIS — H16223 Keratoconjunctivitis sicca, not specified as Sjogren's, bilateral: Secondary | ICD-10-CM | POA: Diagnosis not present

## 2019-12-23 DIAGNOSIS — H25812 Combined forms of age-related cataract, left eye: Secondary | ICD-10-CM | POA: Diagnosis not present

## 2019-12-23 DIAGNOSIS — H02883 Meibomian gland dysfunction of right eye, unspecified eyelid: Secondary | ICD-10-CM | POA: Diagnosis not present

## 2019-12-23 DIAGNOSIS — H02886 Meibomian gland dysfunction of left eye, unspecified eyelid: Secondary | ICD-10-CM | POA: Diagnosis not present

## 2019-12-23 DIAGNOSIS — H16223 Keratoconjunctivitis sicca, not specified as Sjogren's, bilateral: Secondary | ICD-10-CM | POA: Diagnosis not present

## 2019-12-24 DIAGNOSIS — F419 Anxiety disorder, unspecified: Secondary | ICD-10-CM

## 2020-01-17 DIAGNOSIS — F419 Anxiety disorder, unspecified: Secondary | ICD-10-CM

## 2020-01-17 DIAGNOSIS — Z1159 Encounter for screening for other viral diseases: Secondary | ICD-10-CM | POA: Diagnosis not present

## 2020-01-22 DIAGNOSIS — Z1159 Encounter for screening for other viral diseases: Secondary | ICD-10-CM | POA: Diagnosis not present

## 2020-01-23 DIAGNOSIS — Z20822 Contact with and (suspected) exposure to covid-19: Secondary | ICD-10-CM | POA: Diagnosis not present

## 2020-02-03 DIAGNOSIS — J069 Acute upper respiratory infection, unspecified: Secondary | ICD-10-CM | POA: Diagnosis not present

## 2020-02-03 DIAGNOSIS — Z20822 Contact with and (suspected) exposure to covid-19: Secondary | ICD-10-CM | POA: Diagnosis not present

## 2020-03-05 DIAGNOSIS — H02883 Meibomian gland dysfunction of right eye, unspecified eyelid: Secondary | ICD-10-CM | POA: Diagnosis not present

## 2020-03-05 DIAGNOSIS — H25812 Combined forms of age-related cataract, left eye: Secondary | ICD-10-CM | POA: Diagnosis not present

## 2020-03-05 DIAGNOSIS — H02886 Meibomian gland dysfunction of left eye, unspecified eyelid: Secondary | ICD-10-CM | POA: Diagnosis not present

## 2020-03-05 DIAGNOSIS — H16223 Keratoconjunctivitis sicca, not specified as Sjogren's, bilateral: Secondary | ICD-10-CM | POA: Diagnosis not present

## 2020-03-19 DIAGNOSIS — F419 Anxiety disorder, unspecified: Secondary | ICD-10-CM

## 2020-03-30 DIAGNOSIS — H02883 Meibomian gland dysfunction of right eye, unspecified eyelid: Secondary | ICD-10-CM | POA: Diagnosis not present

## 2020-03-30 DIAGNOSIS — H16223 Keratoconjunctivitis sicca, not specified as Sjogren's, bilateral: Secondary | ICD-10-CM | POA: Diagnosis not present

## 2020-03-30 DIAGNOSIS — H02886 Meibomian gland dysfunction of left eye, unspecified eyelid: Secondary | ICD-10-CM | POA: Diagnosis not present

## 2020-03-30 DIAGNOSIS — H25812 Combined forms of age-related cataract, left eye: Secondary | ICD-10-CM | POA: Diagnosis not present

## 2020-04-03 DIAGNOSIS — H903 Sensorineural hearing loss, bilateral: Secondary | ICD-10-CM | POA: Diagnosis not present

## 2020-04-19 DIAGNOSIS — F419 Anxiety disorder, unspecified: Secondary | ICD-10-CM

## 2020-04-19 IMAGING — MR MR HEAD WO/W CM
11 of 12 series · 42 of 48 positions shown · IV contrast (multihance)
Comparison: None.

CLINICAL DATA: Hearing loss. Undergoing consideration of cochlear
implantation

EXAM:
MRI HEAD WITHOUT AND WITH CONTRAST
TECHNIQUE: Multiplanar, multiecho pulse sequences of the brain and surrounding
structures were obtained without and with intravenous contrast.
CONTRAST:  18mL MULTIHANCE GADOBENATE DIMEGLUMINE 529 MG/ML IV SOLN

[Series 5: T1 · sagittal · 4.0mm · 0.72mm/px · 3 of 31 slices shown (1 of 3)]
[im 1/31]
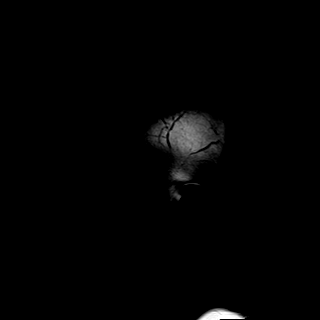
[im 16/31]
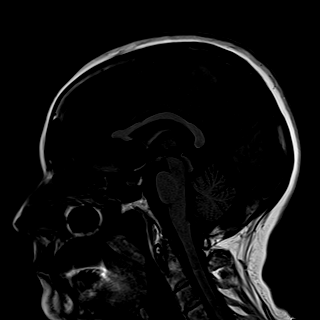
[im 31/31]
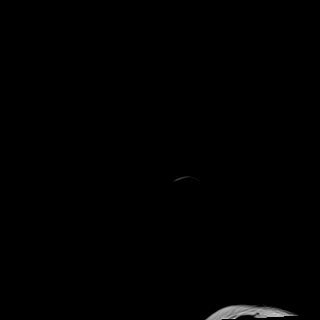

[Series 6: DWI · axial · 3.0mm · 1.44mm/px · z∈[-78,+86]mm · 7 of 86 slices shown (1 of 2)]
[im 1/86]
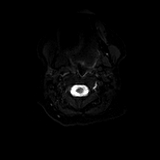
[im 15/86]
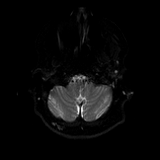
[im 29/86]
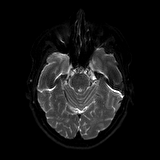
[im 43/86]
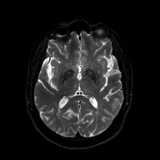
[im 57/86]
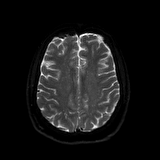
[im 71/86]
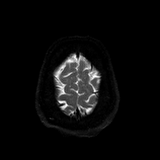
[im 86/86]
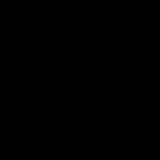

[Series 7: DWI · axial · 3.0mm · 1.44mm/px · z∈[-78,+86]mm · 3 of 43 slices shown (2 of 2)]
[im 1/43]
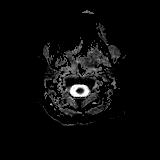
[im 22/43]
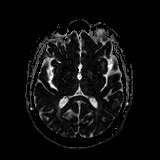
[im 43/43]
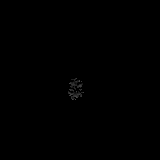

[Series 8: T2 · axial · 4.0mm · 0.36mm/px · z∈[-75,+81]mm · 2 of 31 slices shown]
[im 1/31]
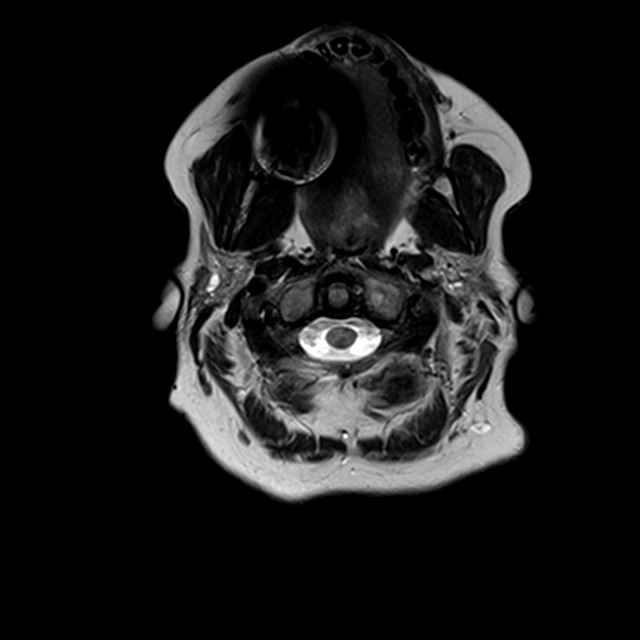
[im 31/31]
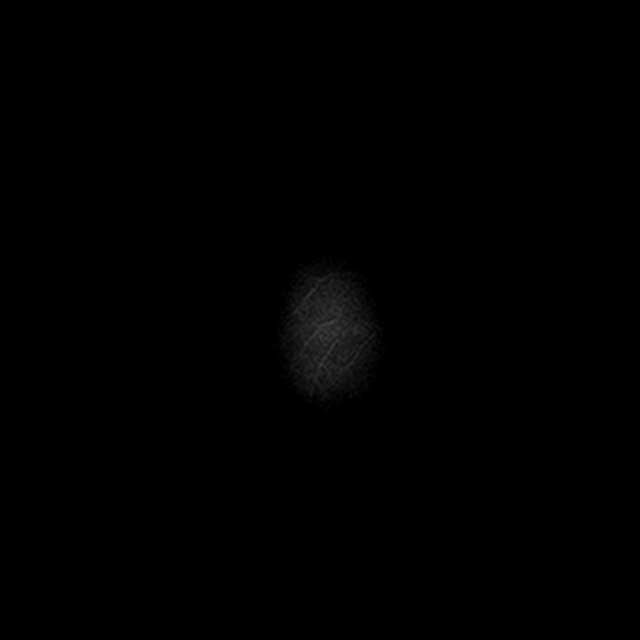

[Series 9: FLAIR · axial · 3.0mm · 0.72mm/px · z∈[-78,+84]mm · 2 of 28 slices shown]
[im 1/28]
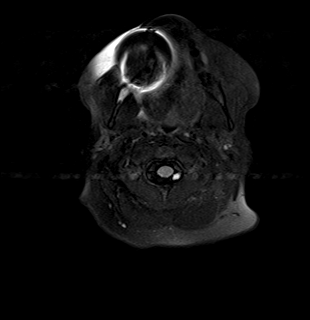
[im 28/28]
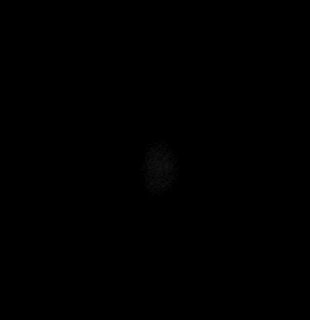

[Series 11: swi_images · axial · 1.5mm · 0.94mm/px · z∈[-68,+74]mm · 8 of 96 slices shown]
[im 1/96]
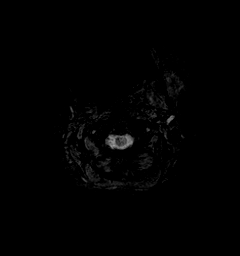
[im 14/96]
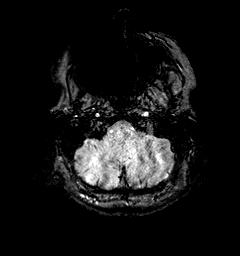
[im 28/96]
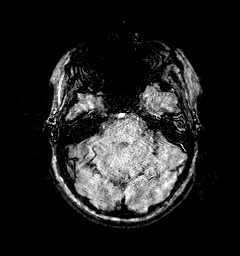
[im 41/96]
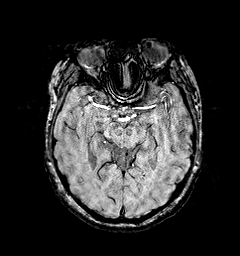
[im 55/96]
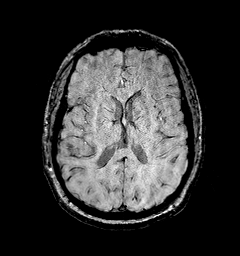
[im 68/96]
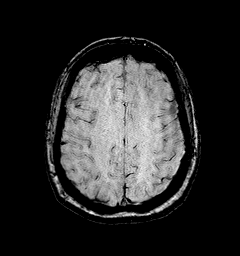
[im 82/96]
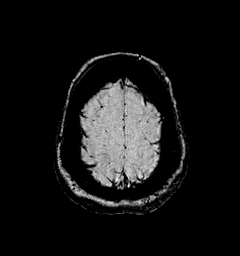
[im 96/96]
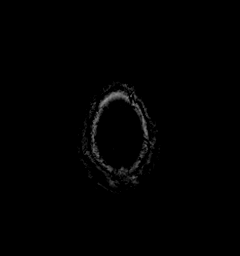

[Series 12: T1 · coronal · 2.5mm · 0.56mm/px · 1 of 15 slices shown (2 of 3)]
[im 1/15]
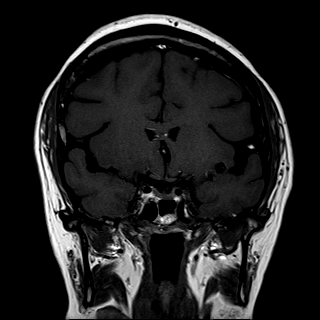

[Series 13: T1 · axial · 2.5mm · 0.50mm/px · 1 of 15 slices shown (3 of 3)]
[im 1/15]
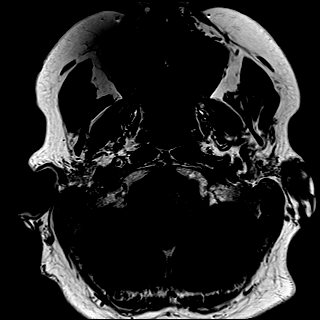

[Series 15: T1 post-contrast · coronal · 2.5mm · 0.56mm/px · 1 of 15 slices shown (1 of 3)]
[im 1/15]
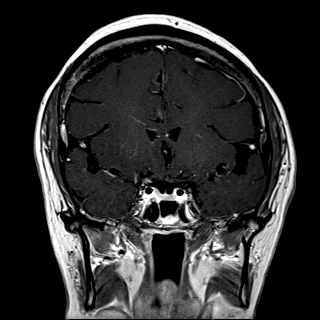

[Series 16: T1 post-contrast · axial · 2.5mm · 0.50mm/px · 1 of 15 slices shown (2 of 3)]
[im 1/15]
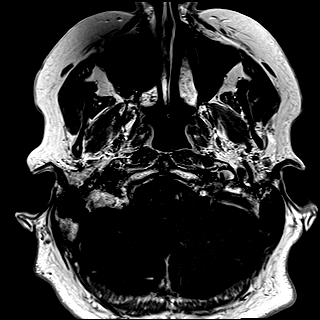

[Series 17: T1 post-contrast · axial · 1.0mm · 0.90mm/px · z∈[-78,+81]mm · 13 of 160 slices shown (3 of 3)]
[im 1/160]
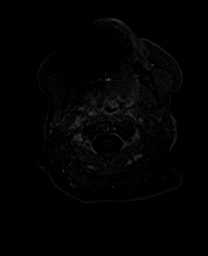
[im 14/160]
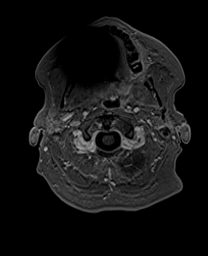
[im 27/160]
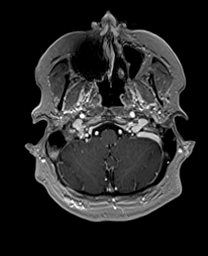
[im 40/160]
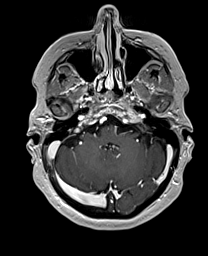
[im 54/160]
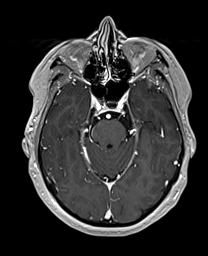
[im 67/160]
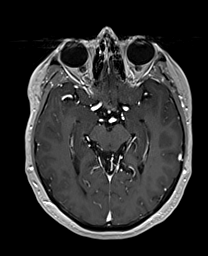
[im 80/160]
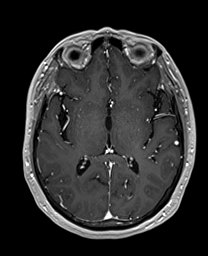
[im 93/160]
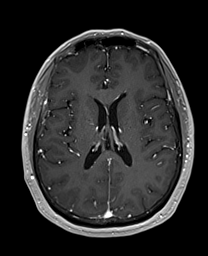
[im 107/160]
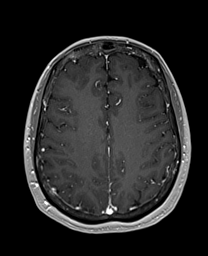
[im 120/160]
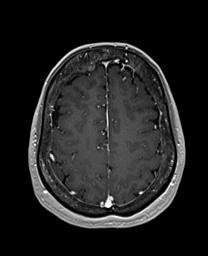
[im 133/160]
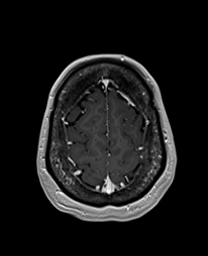
[im 146/160]
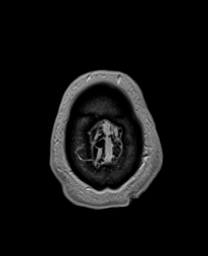
[im 160/160]
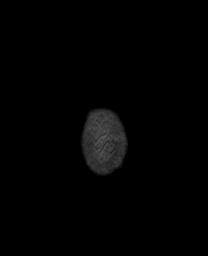

[42 of 48 positions shown; findings below may reference images not displayed]

FINDINGS: Brain: Normal appearance of the brainstem, cisterns,
vestibulocochlear nerves, and bilateral labyrinth. No abnormal
temporal bone enhancement.

Brain itself shows no infarct, hemorrhage, hydrocephalus, or
collection. There is mild periventricular FLAIR hyperintensity,
usually microvascular ischemic.

Vascular: Major flow voids and vascular enhancements are preserved.
No diverticulum of the jugular bulbs.

Skull and upper cervical spine: Mild hyperostosis along the inner
table and petrous ridges.

Sinuses/Orbits: Negative. The mastoid and middle ear spaces are
clear.
IMPRESSION: Unremarkable inner ear imaging.

## 2020-04-24 DIAGNOSIS — H9313 Tinnitus, bilateral: Secondary | ICD-10-CM | POA: Diagnosis not present

## 2020-04-24 DIAGNOSIS — H832X1 Labyrinthine dysfunction, right ear: Secondary | ICD-10-CM | POA: Diagnosis not present

## 2020-04-24 DIAGNOSIS — H903 Sensorineural hearing loss, bilateral: Secondary | ICD-10-CM | POA: Diagnosis not present

## 2020-04-24 DIAGNOSIS — Z9621 Cochlear implant status: Secondary | ICD-10-CM | POA: Diagnosis not present

## 2020-05-14 DIAGNOSIS — F418 Other specified anxiety disorders: Secondary | ICD-10-CM

## 2020-05-18 DIAGNOSIS — H2512 Age-related nuclear cataract, left eye: Secondary | ICD-10-CM | POA: Diagnosis not present

## 2020-05-18 DIAGNOSIS — F419 Anxiety disorder, unspecified: Secondary | ICD-10-CM

## 2020-05-22 DIAGNOSIS — H25812 Combined forms of age-related cataract, left eye: Secondary | ICD-10-CM | POA: Diagnosis not present

## 2020-06-11 DIAGNOSIS — F418 Other specified anxiety disorders: Secondary | ICD-10-CM

## 2020-06-15 DIAGNOSIS — E7800 Pure hypercholesterolemia, unspecified: Secondary | ICD-10-CM

## 2020-06-15 DIAGNOSIS — Z1239 Encounter for other screening for malignant neoplasm of breast: Secondary | ICD-10-CM

## 2020-06-15 DIAGNOSIS — R7301 Impaired fasting glucose: Secondary | ICD-10-CM

## 2020-06-15 DIAGNOSIS — F418 Other specified anxiety disorders: Secondary | ICD-10-CM

## 2020-06-15 DIAGNOSIS — Z1231 Encounter for screening mammogram for malignant neoplasm of breast: Secondary | ICD-10-CM

## 2020-06-15 DIAGNOSIS — E559 Vitamin D deficiency, unspecified: Secondary | ICD-10-CM

## 2020-06-15 DIAGNOSIS — E039 Hypothyroidism, unspecified: Secondary | ICD-10-CM

## 2020-06-15 DIAGNOSIS — F419 Anxiety disorder, unspecified: Secondary | ICD-10-CM

## 2020-06-15 DIAGNOSIS — Z0000 Encounter for general adult medical examination without abnormal findings: Secondary | ICD-10-CM

## 2020-06-15 MED ORDER — BUPROPION HCL ER (XL) 150 MG PO TB24: 150.0000 mg | ORAL_TABLET | Freq: Every day | ORAL | 0 refills | Status: DC

## 2020-06-15 MED ORDER — LEVOTHYROXINE SODIUM 112 MCG PO TABS: ORAL_TABLET | ORAL | 0 refills | Status: DC

## 2020-06-15 MED ORDER — SERTRALINE HCL 50 MG PO TABS: 1.0000 | ORAL_TABLET | Freq: Every day | ORAL | 0 refills | Status: DC

## 2020-06-15 MED ORDER — CLONAZEPAM 0.5 MG PO TABS: 0.5000 mg | ORAL_TABLET | Freq: Two times a day (BID) | ORAL | 0 refills | Status: DC | PRN

## 2020-07-10 DIAGNOSIS — Z0000 Encounter for general adult medical examination without abnormal findings: Secondary | ICD-10-CM | POA: Diagnosis not present

## 2020-07-10 DIAGNOSIS — E559 Vitamin D deficiency, unspecified: Secondary | ICD-10-CM | POA: Diagnosis not present

## 2020-07-10 DIAGNOSIS — R7301 Impaired fasting glucose: Secondary | ICD-10-CM | POA: Diagnosis not present

## 2020-07-10 DIAGNOSIS — E7800 Pure hypercholesterolemia, unspecified: Secondary | ICD-10-CM | POA: Diagnosis not present

## 2020-07-10 DIAGNOSIS — E039 Hypothyroidism, unspecified: Secondary | ICD-10-CM | POA: Diagnosis not present

## 2020-07-13 DIAGNOSIS — F418 Other specified anxiety disorders: Secondary | ICD-10-CM

## 2020-07-17 DIAGNOSIS — F418 Other specified anxiety disorders: Secondary | ICD-10-CM

## 2020-07-17 DIAGNOSIS — R7301 Impaired fasting glucose: Secondary | ICD-10-CM | POA: Diagnosis not present

## 2020-07-17 DIAGNOSIS — Z0000 Encounter for general adult medical examination without abnormal findings: Secondary | ICD-10-CM

## 2020-07-17 DIAGNOSIS — E039 Hypothyroidism, unspecified: Secondary | ICD-10-CM | POA: Diagnosis not present

## 2020-07-17 DIAGNOSIS — Z1211 Encounter for screening for malignant neoplasm of colon: Secondary | ICD-10-CM

## 2020-07-17 DIAGNOSIS — Z1231 Encounter for screening mammogram for malignant neoplasm of breast: Secondary | ICD-10-CM

## 2020-07-17 DIAGNOSIS — E782 Mixed hyperlipidemia: Secondary | ICD-10-CM

## 2020-07-17 DIAGNOSIS — F419 Anxiety disorder, unspecified: Secondary | ICD-10-CM

## 2020-07-17 DIAGNOSIS — K219 Gastro-esophageal reflux disease without esophagitis: Secondary | ICD-10-CM

## 2020-07-17 MED ORDER — CLONAZEPAM 0.5 MG PO TABS: 0.5000 mg | ORAL_TABLET | Freq: Two times a day (BID) | ORAL | 0 refills | Status: DC | PRN

## 2020-07-19 DIAGNOSIS — F419 Anxiety disorder, unspecified: Secondary | ICD-10-CM

## 2020-08-19 DIAGNOSIS — F419 Anxiety disorder, unspecified: Secondary | ICD-10-CM

## 2020-09-16 DIAGNOSIS — F419 Anxiety disorder, unspecified: Secondary | ICD-10-CM

## 2020-10-21 DIAGNOSIS — F419 Anxiety disorder, unspecified: Secondary | ICD-10-CM

## 2020-11-22 DIAGNOSIS — F419 Anxiety disorder, unspecified: Secondary | ICD-10-CM

## 2020-11-27 DIAGNOSIS — F419 Anxiety disorder, unspecified: Secondary | ICD-10-CM

## 2020-11-27 MED ORDER — CLONAZEPAM 0.5 MG PO TABS: 0.5000 mg | ORAL_TABLET | Freq: Two times a day (BID) | ORAL | 0 refills | Status: DC | PRN

## 2020-11-30 DIAGNOSIS — Z1211 Encounter for screening for malignant neoplasm of colon: Secondary | ICD-10-CM | POA: Diagnosis not present

## 2020-11-30 DIAGNOSIS — K635 Polyp of colon: Secondary | ICD-10-CM | POA: Diagnosis not present

## 2020-11-30 DIAGNOSIS — D123 Benign neoplasm of transverse colon: Secondary | ICD-10-CM | POA: Diagnosis not present

## 2020-11-30 DIAGNOSIS — K5730 Diverticulosis of large intestine without perforation or abscess without bleeding: Secondary | ICD-10-CM | POA: Diagnosis not present

## 2020-12-30 DIAGNOSIS — F419 Anxiety disorder, unspecified: Secondary | ICD-10-CM

## 2021-01-26 DIAGNOSIS — F418 Other specified anxiety disorders: Secondary | ICD-10-CM

## 2021-02-05 DIAGNOSIS — F419 Anxiety disorder, unspecified: Secondary | ICD-10-CM

## 2021-02-20 DIAGNOSIS — F418 Other specified anxiety disorders: Secondary | ICD-10-CM

## 2021-03-10 MED ORDER — LEVOTHYROXINE SODIUM 112 MCG PO TABS: 112.0000 ug | ORAL_TABLET | Freq: Every day | ORAL | 0 refills | Status: DC

## 2021-03-19 DIAGNOSIS — Z0000 Encounter for general adult medical examination without abnormal findings: Secondary | ICD-10-CM

## 2021-03-19 DIAGNOSIS — E039 Hypothyroidism, unspecified: Secondary | ICD-10-CM | POA: Diagnosis not present

## 2021-03-19 DIAGNOSIS — E7800 Pure hypercholesterolemia, unspecified: Secondary | ICD-10-CM | POA: Diagnosis not present

## 2021-03-19 DIAGNOSIS — F411 Generalized anxiety disorder: Secondary | ICD-10-CM

## 2021-03-19 DIAGNOSIS — Z7689 Persons encountering health services in other specified circumstances: Secondary | ICD-10-CM

## 2021-03-19 DIAGNOSIS — F418 Other specified anxiety disorders: Secondary | ICD-10-CM

## 2021-03-19 DIAGNOSIS — E559 Vitamin D deficiency, unspecified: Secondary | ICD-10-CM

## 2021-03-19 MED ORDER — BUPROPION HCL ER (XL) 150 MG PO TB24: ORAL_TABLET | ORAL | 1 refills | Status: DC

## 2021-03-19 MED ORDER — CLONAZEPAM 0.5 MG PO TABS: ORAL_TABLET | ORAL | 2 refills | Status: DC

## 2021-03-19 MED ORDER — LEVOTHYROXINE SODIUM 112 MCG PO TABS: 112.0000 ug | ORAL_TABLET | Freq: Every day | ORAL | 1 refills | Status: DC

## 2021-03-19 MED ORDER — SERTRALINE HCL 100 MG PO TABS: 100.0000 mg | ORAL_TABLET | Freq: Every day | ORAL | 1 refills | Status: DC

## 2021-03-24 MED ORDER — CLOBETASOL PROPIONATE 0.05 % EX OINT: TOPICAL_OINTMENT | CUTANEOUS | 0 refills | Status: DC

## 2021-04-02 DIAGNOSIS — E7800 Pure hypercholesterolemia, unspecified: Secondary | ICD-10-CM | POA: Diagnosis not present

## 2021-04-02 DIAGNOSIS — E559 Vitamin D deficiency, unspecified: Secondary | ICD-10-CM | POA: Diagnosis not present

## 2021-04-02 DIAGNOSIS — E039 Hypothyroidism, unspecified: Secondary | ICD-10-CM | POA: Diagnosis not present

## 2021-04-02 DIAGNOSIS — Z0000 Encounter for general adult medical examination without abnormal findings: Secondary | ICD-10-CM | POA: Diagnosis not present

## 2021-04-05 DIAGNOSIS — H903 Sensorineural hearing loss, bilateral: Secondary | ICD-10-CM | POA: Diagnosis not present

## 2021-04-05 DIAGNOSIS — Z45321 Encounter for adjustment and management of cochlear device: Secondary | ICD-10-CM | POA: Diagnosis not present

## 2021-05-22 DIAGNOSIS — F418 Other specified anxiety disorders: Secondary | ICD-10-CM

## 2021-06-16 DIAGNOSIS — F411 Generalized anxiety disorder: Secondary | ICD-10-CM

## 2021-06-18 DIAGNOSIS — F418 Other specified anxiety disorders: Secondary | ICD-10-CM

## 2021-07-23 DIAGNOSIS — E66812 Obesity, class 2: Secondary | ICD-10-CM | POA: Insufficient documentation

## 2021-07-23 DIAGNOSIS — H6501 Acute serous otitis media, right ear: Secondary | ICD-10-CM | POA: Diagnosis not present

## 2021-07-23 DIAGNOSIS — R051 Acute cough: Secondary | ICD-10-CM | POA: Diagnosis not present

## 2021-07-23 DIAGNOSIS — J0140 Acute pansinusitis, unspecified: Secondary | ICD-10-CM | POA: Diagnosis not present

## 2021-07-23 DIAGNOSIS — H66011 Acute suppurative otitis media with spontaneous rupture of ear drum, right ear: Secondary | ICD-10-CM | POA: Diagnosis not present

## 2021-07-23 DIAGNOSIS — H903 Sensorineural hearing loss, bilateral: Secondary | ICD-10-CM | POA: Insufficient documentation

## 2021-07-23 DIAGNOSIS — H9313 Tinnitus, bilateral: Secondary | ICD-10-CM | POA: Insufficient documentation

## 2021-07-23 DIAGNOSIS — E66811 Obesity, class 1: Secondary | ICD-10-CM | POA: Insufficient documentation

## 2021-07-23 DIAGNOSIS — K760 Fatty (change of) liver, not elsewhere classified: Secondary | ICD-10-CM | POA: Insufficient documentation

## 2021-07-23 DIAGNOSIS — R0982 Postnasal drip: Secondary | ICD-10-CM | POA: Diagnosis not present

## 2021-08-05 DIAGNOSIS — Z961 Presence of intraocular lens: Secondary | ICD-10-CM | POA: Diagnosis not present

## 2021-08-05 DIAGNOSIS — H02883 Meibomian gland dysfunction of right eye, unspecified eyelid: Secondary | ICD-10-CM | POA: Diagnosis not present

## 2021-08-05 DIAGNOSIS — H02886 Meibomian gland dysfunction of left eye, unspecified eyelid: Secondary | ICD-10-CM | POA: Diagnosis not present

## 2021-08-05 DIAGNOSIS — H16223 Keratoconjunctivitis sicca, not specified as Sjogren's, bilateral: Secondary | ICD-10-CM | POA: Diagnosis not present

## 2021-08-25 DIAGNOSIS — H02883 Meibomian gland dysfunction of right eye, unspecified eyelid: Secondary | ICD-10-CM | POA: Diagnosis not present

## 2021-08-25 DIAGNOSIS — H02886 Meibomian gland dysfunction of left eye, unspecified eyelid: Secondary | ICD-10-CM | POA: Diagnosis not present

## 2021-08-25 DIAGNOSIS — H16223 Keratoconjunctivitis sicca, not specified as Sjogren's, bilateral: Secondary | ICD-10-CM | POA: Diagnosis not present

## 2021-08-25 DIAGNOSIS — Z961 Presence of intraocular lens: Secondary | ICD-10-CM | POA: Diagnosis not present

## 2021-09-16 DIAGNOSIS — Z1329 Encounter for screening for other suspected endocrine disorder: Secondary | ICD-10-CM | POA: Diagnosis not present

## 2021-09-16 DIAGNOSIS — Z13220 Encounter for screening for lipoid disorders: Secondary | ICD-10-CM | POA: Diagnosis not present

## 2021-09-16 DIAGNOSIS — Z0000 Encounter for general adult medical examination without abnormal findings: Secondary | ICD-10-CM | POA: Insufficient documentation

## 2021-09-16 DIAGNOSIS — M62838 Other muscle spasm: Secondary | ICD-10-CM | POA: Insufficient documentation

## 2021-09-16 DIAGNOSIS — F418 Other specified anxiety disorders: Secondary | ICD-10-CM

## 2021-09-16 MED ORDER — METHOCARBAMOL 500 MG PO TABS: 500.0000 mg | ORAL_TABLET | Freq: Three times a day (TID) | ORAL | 0 refills | Status: DC | PRN

## 2021-09-16 MED ORDER — SERTRALINE HCL 50 MG PO TABS: 50.0000 mg | ORAL_TABLET | Freq: Every day | ORAL | 3 refills | Status: DC

## 2021-09-20 DIAGNOSIS — H02883 Meibomian gland dysfunction of right eye, unspecified eyelid: Secondary | ICD-10-CM | POA: Diagnosis not present

## 2021-09-20 DIAGNOSIS — H02886 Meibomian gland dysfunction of left eye, unspecified eyelid: Secondary | ICD-10-CM | POA: Diagnosis not present

## 2021-09-20 DIAGNOSIS — H10413 Chronic giant papillary conjunctivitis, bilateral: Secondary | ICD-10-CM | POA: Diagnosis not present

## 2021-09-20 DIAGNOSIS — H40013 Open angle with borderline findings, low risk, bilateral: Secondary | ICD-10-CM | POA: Diagnosis not present

## 2021-10-08 DIAGNOSIS — Z0000 Encounter for general adult medical examination without abnormal findings: Secondary | ICD-10-CM | POA: Diagnosis not present

## 2021-10-08 DIAGNOSIS — Z1329 Encounter for screening for other suspected endocrine disorder: Secondary | ICD-10-CM | POA: Diagnosis not present

## 2021-10-08 DIAGNOSIS — Z13220 Encounter for screening for lipoid disorders: Secondary | ICD-10-CM | POA: Diagnosis not present

## 2021-10-08 DIAGNOSIS — F418 Other specified anxiety disorders: Secondary | ICD-10-CM

## 2021-10-11 DIAGNOSIS — Z23 Encounter for immunization: Secondary | ICD-10-CM

## 2021-10-11 DIAGNOSIS — Z1231 Encounter for screening mammogram for malignant neoplasm of breast: Secondary | ICD-10-CM | POA: Diagnosis not present

## 2021-10-11 DIAGNOSIS — Z0000 Encounter for general adult medical examination without abnormal findings: Secondary | ICD-10-CM | POA: Diagnosis not present

## 2021-10-21 DIAGNOSIS — F418 Other specified anxiety disorders: Secondary | ICD-10-CM

## 2021-10-25 DIAGNOSIS — F411 Generalized anxiety disorder: Secondary | ICD-10-CM

## 2021-10-25 MED ORDER — BUPROPION HCL ER (XL) 150 MG PO TB24: 150.0000 mg | ORAL_TABLET | Freq: Every day | ORAL | 2 refills | Status: DC

## 2021-11-16 DIAGNOSIS — F418 Other specified anxiety disorders: Secondary | ICD-10-CM

## 2021-11-16 DIAGNOSIS — F411 Generalized anxiety disorder: Secondary | ICD-10-CM

## 2021-11-16 MED ORDER — CLONAZEPAM 0.5 MG PO TABS: ORAL_TABLET | ORAL | 1 refills | Status: DC

## 2021-11-18 DIAGNOSIS — H02883 Meibomian gland dysfunction of right eye, unspecified eyelid: Secondary | ICD-10-CM | POA: Diagnosis not present

## 2021-11-18 DIAGNOSIS — H26491 Other secondary cataract, right eye: Secondary | ICD-10-CM | POA: Diagnosis not present

## 2021-11-18 DIAGNOSIS — H10413 Chronic giant papillary conjunctivitis, bilateral: Secondary | ICD-10-CM | POA: Diagnosis not present

## 2021-11-18 DIAGNOSIS — H40013 Open angle with borderline findings, low risk, bilateral: Secondary | ICD-10-CM | POA: Diagnosis not present

## 2021-11-19 DIAGNOSIS — F411 Generalized anxiety disorder: Secondary | ICD-10-CM

## 2021-11-19 MED ORDER — CLONAZEPAM 0.5 MG PO TABS: ORAL_TABLET | ORAL | 0 refills | Status: DC

## 2021-11-22 DIAGNOSIS — F418 Other specified anxiety disorders: Secondary | ICD-10-CM

## 2021-11-22 DIAGNOSIS — F411 Generalized anxiety disorder: Secondary | ICD-10-CM

## 2021-11-22 MED ORDER — CLONAZEPAM 0.5 MG PO TABS: ORAL_TABLET | ORAL | 0 refills | Status: DC

## 2021-11-25 DIAGNOSIS — H26491 Other secondary cataract, right eye: Secondary | ICD-10-CM | POA: Diagnosis not present

## 2021-12-02 DIAGNOSIS — Z1231 Encounter for screening mammogram for malignant neoplasm of breast: Secondary | ICD-10-CM

## 2021-12-03 DIAGNOSIS — F411 Generalized anxiety disorder: Secondary | ICD-10-CM

## 2021-12-03 DIAGNOSIS — R030 Elevated blood-pressure reading, without diagnosis of hypertension: Secondary | ICD-10-CM

## 2021-12-03 MED ORDER — BUSPIRONE HCL 15 MG PO TABS: 15.0000 mg | ORAL_TABLET | Freq: Three times a day (TID) | ORAL | 2 refills | Status: DC | PRN

## 2021-12-03 MED ORDER — CLONAZEPAM 0.5 MG PO TABS: ORAL_TABLET | ORAL | 1 refills | Status: DC

## 2021-12-23 DIAGNOSIS — F418 Other specified anxiety disorders: Secondary | ICD-10-CM

## 2022-01-14 DIAGNOSIS — F418 Other specified anxiety disorders: Secondary | ICD-10-CM

## 2022-01-14 DIAGNOSIS — F32A Depression, unspecified: Secondary | ICD-10-CM | POA: Diagnosis not present

## 2022-01-14 DIAGNOSIS — F411 Generalized anxiety disorder: Secondary | ICD-10-CM | POA: Diagnosis not present

## 2022-01-14 MED ORDER — BUSPIRONE HCL 15 MG PO TABS: 15.0000 mg | ORAL_TABLET | Freq: Three times a day (TID) | ORAL | 2 refills | Status: DC | PRN

## 2022-05-17 DIAGNOSIS — H16223 Keratoconjunctivitis sicca, not specified as Sjogren's, bilateral: Secondary | ICD-10-CM | POA: Diagnosis not present

## 2022-05-17 DIAGNOSIS — H35371 Puckering of macula, right eye: Secondary | ICD-10-CM | POA: Diagnosis not present

## 2022-06-03 DIAGNOSIS — F411 Generalized anxiety disorder: Secondary | ICD-10-CM | POA: Diagnosis not present

## 2022-06-03 DIAGNOSIS — F418 Other specified anxiety disorders: Secondary | ICD-10-CM | POA: Diagnosis not present

## 2022-06-03 DIAGNOSIS — R030 Elevated blood-pressure reading, without diagnosis of hypertension: Secondary | ICD-10-CM | POA: Diagnosis not present

## 2022-06-03 MED ORDER — SERTRALINE HCL 50 MG PO TABS: 50.0000 mg | ORAL_TABLET | Freq: Every day | ORAL | 1 refills | Status: DC

## 2022-06-13 DIAGNOSIS — F411 Generalized anxiety disorder: Secondary | ICD-10-CM

## 2022-06-13 DIAGNOSIS — F418 Other specified anxiety disorders: Secondary | ICD-10-CM

## 2022-06-13 MED ORDER — SERTRALINE HCL 50 MG PO TABS: 50.0000 mg | ORAL_TABLET | Freq: Every day | ORAL | 1 refills | Status: DC

## 2022-06-13 MED ORDER — BUPROPION HCL ER (XL) 150 MG PO TB24: 150.0000 mg | ORAL_TABLET | Freq: Every day | ORAL | 1 refills | Status: DC

## 2022-06-13 MED ORDER — BUSPIRONE HCL 15 MG PO TABS: 15.0000 mg | ORAL_TABLET | Freq: Three times a day (TID) | ORAL | 1 refills | Status: DC | PRN

## 2022-06-13 MED ORDER — FLUTICASONE PROPIONATE 50 MCG/ACT NA SUSP: NASAL | 5 refills | Status: DC

## 2022-06-13 MED ORDER — CLOBETASOL PROPIONATE 0.05 % EX OINT: TOPICAL_OINTMENT | CUTANEOUS | 0 refills | Status: DC

## 2022-06-14 MED ORDER — LEVOTHYROXINE SODIUM 112 MCG PO TABS: 112.0000 ug | ORAL_TABLET | Freq: Every day | ORAL | 1 refills | Status: DC

## 2022-08-17 DIAGNOSIS — F418 Other specified anxiety disorders: Secondary | ICD-10-CM

## 2022-08-17 DIAGNOSIS — F411 Generalized anxiety disorder: Secondary | ICD-10-CM

## 2022-09-05 DIAGNOSIS — D225 Melanocytic nevi of trunk: Secondary | ICD-10-CM | POA: Diagnosis not present

## 2022-09-05 DIAGNOSIS — D235 Other benign neoplasm of skin of trunk: Secondary | ICD-10-CM | POA: Diagnosis not present

## 2022-09-05 DIAGNOSIS — L814 Other melanin hyperpigmentation: Secondary | ICD-10-CM | POA: Diagnosis not present

## 2022-09-05 DIAGNOSIS — L821 Other seborrheic keratosis: Secondary | ICD-10-CM | POA: Diagnosis not present

## 2022-10-10 DIAGNOSIS — Z0000 Encounter for general adult medical examination without abnormal findings: Secondary | ICD-10-CM

## 2022-10-10 DIAGNOSIS — E039 Hypothyroidism, unspecified: Secondary | ICD-10-CM

## 2022-10-10 DIAGNOSIS — E782 Mixed hyperlipidemia: Secondary | ICD-10-CM

## 2022-10-21 DIAGNOSIS — E039 Hypothyroidism, unspecified: Secondary | ICD-10-CM | POA: Diagnosis not present

## 2022-10-21 DIAGNOSIS — Z0000 Encounter for general adult medical examination without abnormal findings: Secondary | ICD-10-CM | POA: Diagnosis not present

## 2022-10-21 DIAGNOSIS — E782 Mixed hyperlipidemia: Secondary | ICD-10-CM | POA: Diagnosis not present

## 2022-10-28 DIAGNOSIS — Z0000 Encounter for general adult medical examination without abnormal findings: Secondary | ICD-10-CM

## 2022-10-28 DIAGNOSIS — R7303 Prediabetes: Secondary | ICD-10-CM | POA: Insufficient documentation

## 2022-10-28 DIAGNOSIS — Z23 Encounter for immunization: Secondary | ICD-10-CM | POA: Diagnosis not present

## 2022-10-28 DIAGNOSIS — Z1231 Encounter for screening mammogram for malignant neoplasm of breast: Secondary | ICD-10-CM

## 2022-10-28 DIAGNOSIS — E6609 Other obesity due to excess calories: Secondary | ICD-10-CM

## 2022-10-28 DIAGNOSIS — R739 Hyperglycemia, unspecified: Secondary | ICD-10-CM | POA: Insufficient documentation

## 2022-10-28 DIAGNOSIS — Z6839 Body mass index (BMI) 39.0-39.9, adult: Secondary | ICD-10-CM

## 2022-11-03 DIAGNOSIS — Z6838 Body mass index (BMI) 38.0-38.9, adult: Secondary | ICD-10-CM | POA: Diagnosis not present

## 2022-11-03 DIAGNOSIS — F418 Other specified anxiety disorders: Secondary | ICD-10-CM | POA: Diagnosis not present

## 2022-11-03 DIAGNOSIS — R7303 Prediabetes: Secondary | ICD-10-CM | POA: Diagnosis not present

## 2022-11-07 DIAGNOSIS — H11433 Conjunctival hyperemia, bilateral: Secondary | ICD-10-CM | POA: Diagnosis not present

## 2022-11-07 DIAGNOSIS — H16143 Punctate keratitis, bilateral: Secondary | ICD-10-CM | POA: Diagnosis not present

## 2022-11-07 DIAGNOSIS — Z0289 Encounter for other administrative examinations: Secondary | ICD-10-CM

## 2022-12-12 DIAGNOSIS — E669 Obesity, unspecified: Secondary | ICD-10-CM | POA: Diagnosis not present

## 2022-12-12 DIAGNOSIS — R9431 Abnormal electrocardiogram [ECG] [EKG]: Secondary | ICD-10-CM | POA: Diagnosis not present

## 2022-12-12 DIAGNOSIS — K760 Fatty (change of) liver, not elsewhere classified: Secondary | ICD-10-CM | POA: Diagnosis not present

## 2022-12-12 DIAGNOSIS — R0602 Shortness of breath: Secondary | ICD-10-CM

## 2022-12-12 DIAGNOSIS — R030 Elevated blood-pressure reading, without diagnosis of hypertension: Secondary | ICD-10-CM

## 2022-12-12 DIAGNOSIS — E66812 Obesity, class 2: Secondary | ICD-10-CM | POA: Diagnosis not present

## 2022-12-12 DIAGNOSIS — Z6839 Body mass index (BMI) 39.0-39.9, adult: Secondary | ICD-10-CM | POA: Diagnosis not present

## 2022-12-12 DIAGNOSIS — R5383 Other fatigue: Secondary | ICD-10-CM

## 2022-12-12 DIAGNOSIS — E039 Hypothyroidism, unspecified: Secondary | ICD-10-CM | POA: Diagnosis not present

## 2022-12-12 DIAGNOSIS — F418 Other specified anxiety disorders: Secondary | ICD-10-CM | POA: Diagnosis not present

## 2022-12-16 DIAGNOSIS — H9193 Unspecified hearing loss, bilateral: Secondary | ICD-10-CM | POA: Diagnosis not present

## 2022-12-16 DIAGNOSIS — Z9621 Cochlear implant status: Secondary | ICD-10-CM | POA: Diagnosis not present

## 2022-12-16 DIAGNOSIS — Z45321 Encounter for adjustment and management of cochlear device: Secondary | ICD-10-CM | POA: Diagnosis not present

## 2022-12-16 DIAGNOSIS — H903 Sensorineural hearing loss, bilateral: Secondary | ICD-10-CM | POA: Diagnosis not present

## 2022-12-22 DIAGNOSIS — H903 Sensorineural hearing loss, bilateral: Secondary | ICD-10-CM | POA: Diagnosis not present

## 2022-12-22 DIAGNOSIS — H832X1 Labyrinthine dysfunction, right ear: Secondary | ICD-10-CM | POA: Diagnosis not present

## 2022-12-22 DIAGNOSIS — H9313 Tinnitus, bilateral: Secondary | ICD-10-CM | POA: Diagnosis not present

## 2022-12-22 DIAGNOSIS — Z9621 Cochlear implant status: Secondary | ICD-10-CM | POA: Diagnosis not present

## 2022-12-27 DIAGNOSIS — R9431 Abnormal electrocardiogram [ECG] [EKG]: Secondary | ICD-10-CM | POA: Insufficient documentation

## 2022-12-27 DIAGNOSIS — E6609 Other obesity due to excess calories: Secondary | ICD-10-CM

## 2022-12-27 DIAGNOSIS — G4730 Sleep apnea, unspecified: Secondary | ICD-10-CM | POA: Insufficient documentation

## 2022-12-27 DIAGNOSIS — R0609 Other forms of dyspnea: Secondary | ICD-10-CM | POA: Diagnosis not present

## 2022-12-27 DIAGNOSIS — R7303 Prediabetes: Secondary | ICD-10-CM | POA: Diagnosis not present

## 2022-12-27 DIAGNOSIS — R030 Elevated blood-pressure reading, without diagnosis of hypertension: Secondary | ICD-10-CM

## 2022-12-27 DIAGNOSIS — E88819 Insulin resistance, unspecified: Secondary | ICD-10-CM | POA: Insufficient documentation

## 2022-12-27 DIAGNOSIS — E66812 Obesity, class 2: Secondary | ICD-10-CM | POA: Insufficient documentation

## 2022-12-27 DIAGNOSIS — Z6839 Body mass index (BMI) 39.0-39.9, adult: Secondary | ICD-10-CM

## 2022-12-27 MED ORDER — METFORMIN HCL ER 500 MG PO TB24: 500.0000 mg | ORAL_TABLET | Freq: Every day | ORAL | 0 refills | Status: DC

## 2023-01-31 DIAGNOSIS — R7303 Prediabetes: Secondary | ICD-10-CM | POA: Diagnosis not present

## 2023-01-31 DIAGNOSIS — G4711 Idiopathic hypersomnia with long sleep time: Secondary | ICD-10-CM | POA: Insufficient documentation

## 2023-01-31 DIAGNOSIS — G478 Other sleep disorders: Secondary | ICD-10-CM | POA: Insufficient documentation

## 2023-01-31 DIAGNOSIS — R0683 Snoring: Secondary | ICD-10-CM | POA: Diagnosis not present

## 2023-01-31 DIAGNOSIS — D7219 Other eosinophilia: Secondary | ICD-10-CM | POA: Diagnosis not present

## 2023-02-03 DIAGNOSIS — E88819 Insulin resistance, unspecified: Secondary | ICD-10-CM

## 2023-02-16 DIAGNOSIS — F418 Other specified anxiety disorders: Secondary | ICD-10-CM

## 2023-02-16 DIAGNOSIS — F411 Generalized anxiety disorder: Secondary | ICD-10-CM

## 2023-02-21 DIAGNOSIS — B308 Other viral conjunctivitis: Secondary | ICD-10-CM | POA: Diagnosis not present

## 2023-02-23 DIAGNOSIS — N904 Leukoplakia of vulva: Secondary | ICD-10-CM | POA: Insufficient documentation

## 2023-02-23 DIAGNOSIS — R748 Abnormal levels of other serum enzymes: Secondary | ICD-10-CM | POA: Insufficient documentation

## 2023-02-24 DIAGNOSIS — R0600 Dyspnea, unspecified: Secondary | ICD-10-CM

## 2023-02-24 DIAGNOSIS — R739 Hyperglycemia, unspecified: Secondary | ICD-10-CM

## 2023-02-24 DIAGNOSIS — E785 Hyperlipidemia, unspecified: Secondary | ICD-10-CM | POA: Diagnosis not present

## 2023-02-24 DIAGNOSIS — R0609 Other forms of dyspnea: Secondary | ICD-10-CM | POA: Diagnosis not present

## 2023-02-24 DIAGNOSIS — R0683 Snoring: Secondary | ICD-10-CM | POA: Diagnosis not present

## 2023-02-24 DIAGNOSIS — Z8249 Family history of ischemic heart disease and other diseases of the circulatory system: Secondary | ICD-10-CM

## 2023-03-06 DIAGNOSIS — G4730 Sleep apnea, unspecified: Secondary | ICD-10-CM | POA: Diagnosis not present

## 2023-03-06 DIAGNOSIS — E88819 Insulin resistance, unspecified: Secondary | ICD-10-CM

## 2023-03-06 DIAGNOSIS — E66812 Obesity, class 2: Secondary | ICD-10-CM | POA: Diagnosis not present

## 2023-03-06 DIAGNOSIS — F418 Other specified anxiety disorders: Secondary | ICD-10-CM | POA: Diagnosis not present

## 2023-03-06 DIAGNOSIS — E6609 Other obesity due to excess calories: Secondary | ICD-10-CM

## 2023-03-06 DIAGNOSIS — R7303 Prediabetes: Secondary | ICD-10-CM

## 2023-03-06 DIAGNOSIS — Z6837 Body mass index (BMI) 37.0-37.9, adult: Secondary | ICD-10-CM

## 2023-03-06 MED ORDER — RYBELSUS 3 MG PO TABS: 3.0000 mg | ORAL_TABLET | Freq: Every day | ORAL | 0 refills | Status: DC

## 2023-03-15 DIAGNOSIS — G4733 Obstructive sleep apnea (adult) (pediatric): Secondary | ICD-10-CM | POA: Diagnosis not present

## 2023-03-15 DIAGNOSIS — D7219 Other eosinophilia: Secondary | ICD-10-CM

## 2023-03-15 DIAGNOSIS — R0683 Snoring: Secondary | ICD-10-CM

## 2023-03-15 DIAGNOSIS — R7303 Prediabetes: Secondary | ICD-10-CM

## 2023-03-15 DIAGNOSIS — G478 Other sleep disorders: Secondary | ICD-10-CM

## 2023-03-15 DIAGNOSIS — G4711 Idiopathic hypersomnia with long sleep time: Secondary | ICD-10-CM

## 2023-03-24 DIAGNOSIS — G4733 Obstructive sleep apnea (adult) (pediatric): Secondary | ICD-10-CM | POA: Insufficient documentation

## 2023-03-28 DIAGNOSIS — U071 COVID-19: Secondary | ICD-10-CM | POA: Diagnosis not present

## 2023-03-28 MED ORDER — NIRMATRELVIR/RITONAVIR (PAXLOVID) TABLET (RENAL DOSING): 2.0000 | ORAL_TABLET | Freq: Two times a day (BID) | ORAL | 0 refills | Status: AC

## 2023-04-17 DIAGNOSIS — R0600 Dyspnea, unspecified: Secondary | ICD-10-CM | POA: Insufficient documentation

## 2023-04-17 DIAGNOSIS — R0609 Other forms of dyspnea: Secondary | ICD-10-CM | POA: Diagnosis not present

## 2023-04-17 DIAGNOSIS — Z8249 Family history of ischemic heart disease and other diseases of the circulatory system: Secondary | ICD-10-CM | POA: Insufficient documentation

## 2023-05-01 DIAGNOSIS — G4733 Obstructive sleep apnea (adult) (pediatric): Secondary | ICD-10-CM | POA: Diagnosis not present

## 2023-05-09 DIAGNOSIS — Z6837 Body mass index (BMI) 37.0-37.9, adult: Secondary | ICD-10-CM | POA: Diagnosis not present

## 2023-05-09 DIAGNOSIS — E66812 Obesity, class 2: Secondary | ICD-10-CM

## 2023-05-09 DIAGNOSIS — E88819 Insulin resistance, unspecified: Secondary | ICD-10-CM | POA: Diagnosis not present

## 2023-05-09 DIAGNOSIS — G4733 Obstructive sleep apnea (adult) (pediatric): Secondary | ICD-10-CM

## 2023-05-09 DIAGNOSIS — R7303 Prediabetes: Secondary | ICD-10-CM

## 2023-05-09 MED ORDER — QSYMIA 3.75-23 MG PO CP24: ORAL_CAPSULE | ORAL | 0 refills | Status: DC

## 2023-05-12 DIAGNOSIS — F418 Other specified anxiety disorders: Secondary | ICD-10-CM

## 2023-05-17 DIAGNOSIS — R030 Elevated blood-pressure reading, without diagnosis of hypertension: Secondary | ICD-10-CM

## 2023-05-17 DIAGNOSIS — E785 Hyperlipidemia, unspecified: Secondary | ICD-10-CM

## 2023-05-17 DIAGNOSIS — R931 Abnormal findings on diagnostic imaging of heart and coronary circulation: Secondary | ICD-10-CM | POA: Insufficient documentation

## 2023-05-17 DIAGNOSIS — G4733 Obstructive sleep apnea (adult) (pediatric): Secondary | ICD-10-CM | POA: Diagnosis not present

## 2023-05-17 MED ORDER — ATORVASTATIN CALCIUM 10 MG PO TABS: 10.0000 mg | ORAL_TABLET | Freq: Every day | ORAL | 3 refills | Status: DC

## 2023-05-31 MED ORDER — PHENTERMINE-TOPIRAMATE ER 7.5-46 MG PO CP24: ORAL_CAPSULE | ORAL | 0 refills | Status: DC

## 2023-06-01 MED ORDER — PHENTERMINE-TOPIRAMATE ER 7.5-46 MG PO CP24: ORAL_CAPSULE | ORAL | 0 refills | Status: DC

## 2023-06-10 DIAGNOSIS — F418 Other specified anxiety disorders: Secondary | ICD-10-CM

## 2023-06-11 DIAGNOSIS — F418 Other specified anxiety disorders: Secondary | ICD-10-CM

## 2023-06-11 DIAGNOSIS — F411 Generalized anxiety disorder: Secondary | ICD-10-CM

## 2023-06-21 DIAGNOSIS — Z1231 Encounter for screening mammogram for malignant neoplasm of breast: Secondary | ICD-10-CM

## 2023-06-26 DIAGNOSIS — G4733 Obstructive sleep apnea (adult) (pediatric): Secondary | ICD-10-CM | POA: Diagnosis not present

## 2023-06-27 DIAGNOSIS — F418 Other specified anxiety disorders: Secondary | ICD-10-CM

## 2023-06-27 DIAGNOSIS — R5383 Other fatigue: Secondary | ICD-10-CM | POA: Diagnosis not present

## 2023-06-27 DIAGNOSIS — Z6836 Body mass index (BMI) 36.0-36.9, adult: Secondary | ICD-10-CM

## 2023-06-27 DIAGNOSIS — R7303 Prediabetes: Secondary | ICD-10-CM | POA: Diagnosis not present

## 2023-06-27 DIAGNOSIS — E66812 Obesity, class 2: Secondary | ICD-10-CM | POA: Diagnosis not present

## 2023-06-27 MED ORDER — PHENTERMINE-TOPIRAMATE ER 7.5-46 MG PO CP24: ORAL_CAPSULE | ORAL | 0 refills | Status: DC

## 2023-06-28 DIAGNOSIS — R931 Abnormal findings on diagnostic imaging of heart and coronary circulation: Secondary | ICD-10-CM

## 2023-06-28 DIAGNOSIS — E785 Hyperlipidemia, unspecified: Secondary | ICD-10-CM

## 2023-06-30 DIAGNOSIS — R931 Abnormal findings on diagnostic imaging of heart and coronary circulation: Secondary | ICD-10-CM | POA: Diagnosis not present

## 2023-06-30 DIAGNOSIS — E785 Hyperlipidemia, unspecified: Secondary | ICD-10-CM | POA: Diagnosis not present

## 2023-07-01 DIAGNOSIS — G4733 Obstructive sleep apnea (adult) (pediatric): Secondary | ICD-10-CM | POA: Diagnosis not present

## 2023-07-20 DIAGNOSIS — Z1231 Encounter for screening mammogram for malignant neoplasm of breast: Secondary | ICD-10-CM

## 2023-07-20 DIAGNOSIS — N904 Leukoplakia of vulva: Secondary | ICD-10-CM

## 2023-07-20 DIAGNOSIS — Z01419 Encounter for gynecological examination (general) (routine) without abnormal findings: Secondary | ICD-10-CM | POA: Insufficient documentation

## 2023-07-20 MED ORDER — CLOBETASOL PROPIONATE 0.05 % EX OINT: 1.0000 | TOPICAL_OINTMENT | Freq: Two times a day (BID) | CUTANEOUS | 0 refills | Status: AC

## 2023-07-24 MED ORDER — PHENTERMINE-TOPIRAMATE ER 7.5-46 MG PO CP24: ORAL_CAPSULE | ORAL | 0 refills | Status: DC

## 2023-07-25 DIAGNOSIS — R87619 Unspecified abnormal cytological findings in specimens from cervix uteri: Secondary | ICD-10-CM | POA: Insufficient documentation

## 2023-08-01 DIAGNOSIS — G4733 Obstructive sleep apnea (adult) (pediatric): Secondary | ICD-10-CM | POA: Diagnosis not present

## 2023-08-04 DIAGNOSIS — F411 Generalized anxiety disorder: Secondary | ICD-10-CM

## 2023-08-04 DIAGNOSIS — H903 Sensorineural hearing loss, bilateral: Secondary | ICD-10-CM | POA: Diagnosis not present

## 2023-08-04 DIAGNOSIS — F418 Other specified anxiety disorders: Secondary | ICD-10-CM

## 2023-08-07 DIAGNOSIS — K760 Fatty (change of) liver, not elsewhere classified: Secondary | ICD-10-CM

## 2023-08-07 DIAGNOSIS — F411 Generalized anxiety disorder: Secondary | ICD-10-CM | POA: Diagnosis not present

## 2023-08-07 DIAGNOSIS — E538 Deficiency of other specified B group vitamins: Secondary | ICD-10-CM | POA: Diagnosis not present

## 2023-08-07 DIAGNOSIS — E559 Vitamin D deficiency, unspecified: Secondary | ICD-10-CM | POA: Diagnosis not present

## 2023-08-07 DIAGNOSIS — Z6835 Body mass index (BMI) 35.0-35.9, adult: Secondary | ICD-10-CM

## 2023-08-07 DIAGNOSIS — E039 Hypothyroidism, unspecified: Secondary | ICD-10-CM | POA: Diagnosis not present

## 2023-08-07 DIAGNOSIS — E66812 Obesity, class 2: Secondary | ICD-10-CM

## 2023-08-07 DIAGNOSIS — F418 Other specified anxiety disorders: Secondary | ICD-10-CM

## 2023-08-07 MED ORDER — SERTRALINE HCL 50 MG PO TABS: 50.0000 mg | ORAL_TABLET | Freq: Every day | ORAL | 0 refills | Status: DC

## 2023-08-07 MED ORDER — LEVOTHYROXINE SODIUM 112 MCG PO TABS: 112.0000 ug | ORAL_TABLET | Freq: Every day | ORAL | 0 refills | Status: DC

## 2023-08-07 MED ORDER — BUPROPION HCL ER (XL) 150 MG PO TB24: 150.0000 mg | ORAL_TABLET | Freq: Every day | ORAL | 1 refills | Status: DC

## 2023-08-08 DIAGNOSIS — R748 Abnormal levels of other serum enzymes: Secondary | ICD-10-CM

## 2023-08-08 DIAGNOSIS — E785 Hyperlipidemia, unspecified: Secondary | ICD-10-CM

## 2023-08-08 DIAGNOSIS — K760 Fatty (change of) liver, not elsewhere classified: Secondary | ICD-10-CM

## 2023-08-24 DIAGNOSIS — H9193 Unspecified hearing loss, bilateral: Secondary | ICD-10-CM | POA: Diagnosis not present

## 2023-08-24 DIAGNOSIS — Z45321 Encounter for adjustment and management of cochlear device: Secondary | ICD-10-CM | POA: Diagnosis not present

## 2023-08-24 DIAGNOSIS — Z9621 Cochlear implant status: Secondary | ICD-10-CM | POA: Diagnosis not present

## 2023-08-25 DIAGNOSIS — G4733 Obstructive sleep apnea (adult) (pediatric): Secondary | ICD-10-CM | POA: Diagnosis not present

## 2023-09-14 DIAGNOSIS — H0288A Meibomian gland dysfunction right eye, upper and lower eyelids: Secondary | ICD-10-CM | POA: Diagnosis not present

## 2023-09-14 DIAGNOSIS — H16223 Keratoconjunctivitis sicca, not specified as Sjogren's, bilateral: Secondary | ICD-10-CM | POA: Diagnosis not present

## 2023-09-14 DIAGNOSIS — H0288B Meibomian gland dysfunction left eye, upper and lower eyelids: Secondary | ICD-10-CM | POA: Diagnosis not present

## 2023-09-15 DIAGNOSIS — R7989 Other specified abnormal findings of blood chemistry: Secondary | ICD-10-CM | POA: Diagnosis not present

## 2023-09-15 DIAGNOSIS — K760 Fatty (change of) liver, not elsewhere classified: Secondary | ICD-10-CM | POA: Diagnosis not present

## 2023-09-22 DIAGNOSIS — E785 Hyperlipidemia, unspecified: Secondary | ICD-10-CM | POA: Diagnosis not present

## 2023-09-22 DIAGNOSIS — R7989 Other specified abnormal findings of blood chemistry: Secondary | ICD-10-CM | POA: Diagnosis not present

## 2023-09-24 DIAGNOSIS — F418 Other specified anxiety disorders: Secondary | ICD-10-CM

## 2023-09-29 DIAGNOSIS — R7989 Other specified abnormal findings of blood chemistry: Secondary | ICD-10-CM | POA: Diagnosis not present

## 2023-09-29 DIAGNOSIS — K760 Fatty (change of) liver, not elsewhere classified: Secondary | ICD-10-CM | POA: Diagnosis not present

## 2023-09-29 DIAGNOSIS — N200 Calculus of kidney: Secondary | ICD-10-CM | POA: Diagnosis not present

## 2023-10-09 DIAGNOSIS — E039 Hypothyroidism, unspecified: Secondary | ICD-10-CM | POA: Diagnosis not present

## 2023-10-09 DIAGNOSIS — R7303 Prediabetes: Secondary | ICD-10-CM | POA: Diagnosis not present

## 2023-10-09 DIAGNOSIS — F418 Other specified anxiety disorders: Secondary | ICD-10-CM

## 2023-10-09 DIAGNOSIS — K760 Fatty (change of) liver, not elsewhere classified: Secondary | ICD-10-CM | POA: Diagnosis not present

## 2023-10-09 DIAGNOSIS — Z6834 Body mass index (BMI) 34.0-34.9, adult: Secondary | ICD-10-CM

## 2023-10-09 DIAGNOSIS — E66811 Obesity, class 1: Secondary | ICD-10-CM

## 2023-10-09 MED ORDER — BUPROPION HCL ER (XL) 150 MG PO TB24: 150.0000 mg | ORAL_TABLET | Freq: Every day | ORAL | 0 refills | Status: DC

## 2023-10-09 MED ORDER — PHENTERMINE-TOPIRAMATE ER 11.25-69 MG PO CP24: 1.0000 | ORAL_CAPSULE | Freq: Every day | ORAL | 1 refills | Status: DC

## 2023-10-18 DIAGNOSIS — R748 Abnormal levels of other serum enzymes: Secondary | ICD-10-CM | POA: Diagnosis not present

## 2023-10-18 DIAGNOSIS — N200 Calculus of kidney: Secondary | ICD-10-CM | POA: Diagnosis not present

## 2023-10-18 DIAGNOSIS — K760 Fatty (change of) liver, not elsewhere classified: Secondary | ICD-10-CM

## 2023-10-18 MED ORDER — TAMSULOSIN HCL 0.4 MG PO CAPS: ORAL_CAPSULE | ORAL | 0 refills | Status: DC

## 2023-10-18 MED ORDER — KETOROLAC TROMETHAMINE 10 MG PO TABS: ORAL_TABLET | ORAL | 0 refills | Status: DC

## 2023-10-24 DIAGNOSIS — E039 Hypothyroidism, unspecified: Secondary | ICD-10-CM

## 2023-11-06 DIAGNOSIS — N200 Calculus of kidney: Secondary | ICD-10-CM

## 2023-11-10 DIAGNOSIS — F411 Generalized anxiety disorder: Secondary | ICD-10-CM | POA: Diagnosis not present

## 2023-11-10 DIAGNOSIS — R0981 Nasal congestion: Secondary | ICD-10-CM

## 2023-11-10 DIAGNOSIS — Z0000 Encounter for general adult medical examination without abnormal findings: Secondary | ICD-10-CM | POA: Diagnosis not present

## 2023-11-10 DIAGNOSIS — E039 Hypothyroidism, unspecified: Secondary | ICD-10-CM | POA: Diagnosis not present

## 2023-11-10 DIAGNOSIS — F418 Other specified anxiety disorders: Secondary | ICD-10-CM | POA: Diagnosis not present

## 2023-11-10 DIAGNOSIS — Z23 Encounter for immunization: Secondary | ICD-10-CM | POA: Diagnosis not present

## 2023-11-10 MED ORDER — LEVOTHYROXINE SODIUM 112 MCG PO TABS: 112.0000 ug | ORAL_TABLET | Freq: Every day | ORAL | 0 refills | Status: DC

## 2023-11-10 MED ORDER — BUSPIRONE HCL 15 MG PO TABS: 15.0000 mg | ORAL_TABLET | Freq: Three times a day (TID) | ORAL | 1 refills | Status: AC | PRN

## 2023-11-10 MED ORDER — SERTRALINE HCL 25 MG PO TABS: 25.0000 mg | ORAL_TABLET | Freq: Every day | ORAL | 3 refills | Status: AC

## 2023-11-10 MED ORDER — BUPROPION HCL ER (XL) 150 MG PO TB24: 150.0000 mg | ORAL_TABLET | Freq: Every day | ORAL | 0 refills | Status: AC

## 2023-12-11 DIAGNOSIS — K760 Fatty (change of) liver, not elsewhere classified: Secondary | ICD-10-CM | POA: Diagnosis not present

## 2023-12-11 DIAGNOSIS — F418 Other specified anxiety disorders: Secondary | ICD-10-CM | POA: Diagnosis not present

## 2023-12-11 DIAGNOSIS — E66811 Obesity, class 1: Secondary | ICD-10-CM | POA: Diagnosis not present

## 2023-12-11 DIAGNOSIS — Z6834 Body mass index (BMI) 34.0-34.9, adult: Secondary | ICD-10-CM

## 2023-12-11 DIAGNOSIS — N200 Calculus of kidney: Secondary | ICD-10-CM

## 2023-12-11 MED ORDER — PHENTERMINE-TOPIRAMATE ER 15-92 MG PO CP24: 1.0000 | ORAL_CAPSULE | Freq: Every day | ORAL | 1 refills | Status: DC

## 2023-12-14 DIAGNOSIS — G4733 Obstructive sleep apnea (adult) (pediatric): Secondary | ICD-10-CM

## 2023-12-14 DIAGNOSIS — K219 Gastro-esophageal reflux disease without esophagitis: Secondary | ICD-10-CM

## 2023-12-14 DIAGNOSIS — E785 Hyperlipidemia, unspecified: Secondary | ICD-10-CM | POA: Diagnosis not present

## 2023-12-14 DIAGNOSIS — M79604 Pain in right leg: Secondary | ICD-10-CM

## 2023-12-14 DIAGNOSIS — R0609 Other forms of dyspnea: Secondary | ICD-10-CM

## 2023-12-14 DIAGNOSIS — M79605 Pain in left leg: Secondary | ICD-10-CM

## 2023-12-14 DIAGNOSIS — R931 Abnormal findings on diagnostic imaging of heart and coronary circulation: Secondary | ICD-10-CM

## 2024-01-16 DIAGNOSIS — E039 Hypothyroidism, unspecified: Secondary | ICD-10-CM

## 2024-01-29 DIAGNOSIS — Z6833 Body mass index (BMI) 33.0-33.9, adult: Secondary | ICD-10-CM | POA: Diagnosis not present

## 2024-01-29 DIAGNOSIS — F418 Other specified anxiety disorders: Secondary | ICD-10-CM

## 2024-01-29 DIAGNOSIS — K760 Fatty (change of) liver, not elsewhere classified: Secondary | ICD-10-CM

## 2024-01-29 DIAGNOSIS — E6609 Other obesity due to excess calories: Secondary | ICD-10-CM

## 2024-01-29 DIAGNOSIS — E66811 Obesity, class 1: Secondary | ICD-10-CM | POA: Diagnosis not present

## 2024-01-29 DIAGNOSIS — R7303 Prediabetes: Secondary | ICD-10-CM | POA: Diagnosis not present

## 2024-02-19 MED ORDER — TYRVAYA 0.03 MG/ACT NA SOLN: 1.0000 | Freq: Two times a day (BID) | NASAL | 3 refills | Status: AC
